# Patient Record
Sex: Female | Born: 1960
Health system: Southern US, Community
[De-identification: ages and names within clinical notes are randomized; demographics above are authoritative.]

## PROBLEM LIST (undated history)

## (undated) DIAGNOSIS — F411 Generalized anxiety disorder: Secondary | ICD-10-CM

## (undated) DIAGNOSIS — F4310 Post-traumatic stress disorder, unspecified: Secondary | ICD-10-CM

## (undated) DIAGNOSIS — F4001 Agoraphobia with panic disorder: Secondary | ICD-10-CM

## (undated) DIAGNOSIS — G4700 Insomnia, unspecified: Secondary | ICD-10-CM

## (undated) DIAGNOSIS — F4321 Adjustment disorder with depressed mood: Secondary | ICD-10-CM

## (undated) DIAGNOSIS — F331 Major depressive disorder, recurrent, moderate: Secondary | ICD-10-CM

## (undated) DIAGNOSIS — Z8659 Personal history of other mental and behavioral disorders: Secondary | ICD-10-CM

## (undated) HISTORY — PX: APPENDECTOMY: SHX54

## (undated) HISTORY — DX: Insomnia, unspecified: G47.00

## (undated) HISTORY — DX: Post-traumatic stress disorder, unspecified: F43.10

## (undated) HISTORY — DX: Generalized anxiety disorder: F41.1

## (undated) HISTORY — DX: Agoraphobia with panic disorder: F40.01

## (undated) HISTORY — DX: Major depressive disorder, recurrent, moderate: F33.1

## (undated) HISTORY — DX: Adjustment disorder with depressed mood: F43.21

## (undated) HISTORY — PX: ABDOMINAL HYSTERECTOMY: SHX81

## (undated) HISTORY — PX: KIDNEY SURGERY: SHX687

## (undated) HISTORY — DX: Personal history of other mental and behavioral disorders: Z86.59

---

## 2012-02-21 ENCOUNTER — Emergency Department: Payer: Self-pay | Admitting: Emergency Medicine

## 2012-11-30 DIAGNOSIS — F410 Panic disorder [episodic paroxysmal anxiety] without agoraphobia: Secondary | ICD-10-CM | POA: Insufficient documentation

## 2012-11-30 DIAGNOSIS — F419 Anxiety disorder, unspecified: Secondary | ICD-10-CM | POA: Insufficient documentation

## 2013-01-09 ENCOUNTER — Emergency Department: Payer: Self-pay | Admitting: Emergency Medicine

## 2013-02-25 LAB — COMPREHENSIVE METABOLIC PANEL
Alkaline Phosphatase: 79 U/L (ref 50–136)
Anion Gap: 5 — ABNORMAL LOW (ref 7–16)
Co2: 30 mmol/L (ref 21–32)
Creatinine: 0.94 mg/dL (ref 0.60–1.30)
EGFR (African American): >60
EGFR (Non-African Amer.): >60
Potassium: 3.8 mmol/L (ref 3.5–5.1)
SGPT (ALT): 18 U/L (ref 12–78)
Sodium: 138 mmol/L (ref 136–145)
Total Protein: 6.4 g/dL (ref 6.4–8.2)

## 2013-02-25 LAB — CBC
HCT: 33 % — ABNORMAL LOW (ref 35.0–47.0)
MCV: 88 fL (ref 80–100)
RDW: 13.3 % (ref 11.5–14.5)
WBC: 8.4 10*3/uL (ref 3.6–11.0)

## 2013-02-26 ENCOUNTER — Inpatient Hospital Stay: Payer: Self-pay | Admitting: Internal Medicine

## 2013-02-26 DIAGNOSIS — N200 Calculus of kidney: Secondary | ICD-10-CM | POA: Insufficient documentation

## 2013-02-26 DIAGNOSIS — J189 Pneumonia, unspecified organism: Secondary | ICD-10-CM | POA: Insufficient documentation

## 2013-02-26 DIAGNOSIS — R0602 Shortness of breath: Secondary | ICD-10-CM | POA: Insufficient documentation

## 2013-02-26 DIAGNOSIS — R079 Chest pain, unspecified: Secondary | ICD-10-CM | POA: Insufficient documentation

## 2013-02-26 DIAGNOSIS — J159 Unspecified bacterial pneumonia: Secondary | ICD-10-CM | POA: Insufficient documentation

## 2013-02-26 DIAGNOSIS — R0789 Other chest pain: Secondary | ICD-10-CM | POA: Insufficient documentation

## 2013-02-26 DIAGNOSIS — J811 Chronic pulmonary edema: Secondary | ICD-10-CM | POA: Insufficient documentation

## 2013-02-26 LAB — URINALYSIS, COMPLETE
Blood: NEGATIVE
Glucose,UR: NEGATIVE mg/dL (ref 0–75)
Leukocyte Esterase: NEGATIVE
Nitrite: NEGATIVE
Ph: 7 (ref 4.5–8.0)
RBC,UR: 2 /HPF (ref 0–5)
Squamous Epithelial: 14
WBC UR: 1 /HPF (ref 0–5)

## 2013-03-23 DIAGNOSIS — I10 Essential (primary) hypertension: Secondary | ICD-10-CM | POA: Insufficient documentation

## 2013-10-22 DIAGNOSIS — K6289 Other specified diseases of anus and rectum: Secondary | ICD-10-CM | POA: Insufficient documentation

## 2013-10-22 DIAGNOSIS — K921 Melena: Secondary | ICD-10-CM | POA: Insufficient documentation

## 2013-10-23 ENCOUNTER — Emergency Department: Payer: Self-pay | Admitting: Internal Medicine

## 2013-10-30 DIAGNOSIS — S060XAA Concussion with loss of consciousness status unknown, initial encounter: Secondary | ICD-10-CM | POA: Insufficient documentation

## 2013-10-30 DIAGNOSIS — IMO0002 Reserved for concepts with insufficient information to code with codable children: Secondary | ICD-10-CM | POA: Insufficient documentation

## 2013-10-30 DIAGNOSIS — W08XXXA Fall from other furniture, initial encounter: Secondary | ICD-10-CM | POA: Insufficient documentation

## 2013-10-30 DIAGNOSIS — S060X9A Concussion with loss of consciousness of unspecified duration, initial encounter: Secondary | ICD-10-CM | POA: Insufficient documentation

## 2014-01-26 DIAGNOSIS — G259 Extrapyramidal and movement disorder, unspecified: Secondary | ICD-10-CM | POA: Insufficient documentation

## 2014-06-25 DIAGNOSIS — T50901A Poisoning by unspecified drugs, medicaments and biological substances, accidental (unintentional), initial encounter: Secondary | ICD-10-CM | POA: Insufficient documentation

## 2014-06-25 DIAGNOSIS — G933 Postviral fatigue syndrome: Secondary | ICD-10-CM | POA: Insufficient documentation

## 2014-06-25 DIAGNOSIS — R635 Abnormal weight gain: Secondary | ICD-10-CM | POA: Insufficient documentation

## 2014-06-25 DIAGNOSIS — G9331 Postviral fatigue syndrome: Secondary | ICD-10-CM | POA: Insufficient documentation

## 2014-08-06 DIAGNOSIS — J4520 Mild intermittent asthma, uncomplicated: Secondary | ICD-10-CM | POA: Insufficient documentation

## 2014-10-02 ENCOUNTER — Other Ambulatory Visit: Payer: Self-pay

## 2014-10-02 DIAGNOSIS — Z8701 Personal history of pneumonia (recurrent): Secondary | ICD-10-CM | POA: Insufficient documentation

## 2014-10-02 DIAGNOSIS — Z9189 Other specified personal risk factors, not elsewhere classified: Secondary | ICD-10-CM | POA: Insufficient documentation

## 2014-10-02 DIAGNOSIS — F4310 Post-traumatic stress disorder, unspecified: Secondary | ICD-10-CM | POA: Insufficient documentation

## 2014-10-02 DIAGNOSIS — F332 Major depressive disorder, recurrent severe without psychotic features: Secondary | ICD-10-CM | POA: Insufficient documentation

## 2014-10-02 DIAGNOSIS — F411 Generalized anxiety disorder: Secondary | ICD-10-CM | POA: Insufficient documentation

## 2014-10-02 DIAGNOSIS — J449 Chronic obstructive pulmonary disease, unspecified: Secondary | ICD-10-CM | POA: Insufficient documentation

## 2014-10-02 DIAGNOSIS — F4001 Agoraphobia with panic disorder: Secondary | ICD-10-CM | POA: Insufficient documentation

## 2014-10-02 DIAGNOSIS — F410 Panic disorder [episodic paroxysmal anxiety] without agoraphobia: Secondary | ICD-10-CM | POA: Insufficient documentation

## 2014-10-02 DIAGNOSIS — Z87442 Personal history of urinary calculi: Secondary | ICD-10-CM | POA: Insufficient documentation

## 2014-10-02 DIAGNOSIS — Z709 Sex counseling, unspecified: Secondary | ICD-10-CM | POA: Insufficient documentation

## 2014-10-02 DIAGNOSIS — F4321 Adjustment disorder with depressed mood: Secondary | ICD-10-CM | POA: Insufficient documentation

## 2014-10-02 DIAGNOSIS — F331 Major depressive disorder, recurrent, moderate: Secondary | ICD-10-CM | POA: Insufficient documentation

## 2014-10-02 DIAGNOSIS — G4700 Insomnia, unspecified: Secondary | ICD-10-CM | POA: Insufficient documentation

## 2014-10-02 DIAGNOSIS — R4020 Unspecified coma: Secondary | ICD-10-CM | POA: Insufficient documentation

## 2014-10-16 NOTE — Discharge Summary (Signed)
PATIENT NAME:  Carol Hensley, [name] MR#:  [id number] DATE OF BIRTH:  06-17-1961  PRIMARY PHYSICIAN: Sharlyn Bolognaobert Luke Kinner, MD.   The patient left AMA.   HOSPITAL COURSE: The patient is a 54 year old Caucasian female with history of kidney stone who presented to the ED with acute-on-chronic left flank pain. She was recently diagnosed with no obstructing kidney stone in the ED. Her pain was sharp, located on the left side with radiation to the groin area.   In addition, the patient also had a sharp pain on the left side of the chest which is pleuritic in nature, worsened by deep breathing. The patient's O2 oxygen saturation was 85%, requiring oxygen in the ED.   For detailed history and physical examination, refer to the admission note dictated by Dr. Clint GuyHower. The patient was admitted for pleuritic chest pain with associated hypoxia. Dr. Clint GuyHower ordered D-dimer which was elevated at 0.6. Dr. Clint GuyHower ordered a CAT scan of chest with PE protocol, but the patient declined due to a concern about her kidney function. The patient's BUN and creatinine are normal. Dr. Clint GuyHower and I explained to the patient about the importance of a CAT scan of the chest to rule out a PE, but the patient declined and she wants to go to [facility] to see  a nephrologist.   She wants to sign AMA. I explained in detail about the importance of getting a CAT scan of the chest to rule out the PE. The patient finally left AMA. I discussed the patient's issue with the patient, the patient's family member, case Production designer, theatre/television/filmmanager, and nurse.   TIME SPENT: About 42 minutes.    ____________________________ Shaune PollackQing Wilmon Conover, MD qc:np D: 02/26/2013 13:55:10 ET T: 02/26/2013 15:42:24 ET JOB#: [id number]  cc: Shaune PollackQing Verbon Giangregorio, MD, <Dictator> Shaune PollackQING Lauretta Sallas MD ELECTRONICALLY SIGNED 02/27/2013 15:37

## 2014-10-16 NOTE — H&P (Signed)
PATIENT NAME:  Carol Hensley, Carol Hensley MR#:  [id number] DATE OF BIRTH:  1960/07/14  DATE OF ADMISSION:  02/26/2013  REFERRING PHYSICIAN:  Dr. Manson PasseyBrown   PRIMARY CARE PHYSICIAN:  Dr. Cyril LoosenKinner  HISTORY OF PRESENT ILLNESS:  This patient is a 54 year old Caucasian female with past medical history of kidney stones who is presenting with acute on chronic left flank pain. She had recently been diagnosed with nonobstructive kidney stones at the Emergency Department.  Her pain is sharp and located over the left side with radiation to the groin, 8 to 10 out of 10 in intensity without worsening or relieving factors. She has no associated dysuria or change in urinary frequency, fevers or chills. She is also mentioning having acute chest pain with shortness of breath that started approximately three days ago when she was resting. No prior episodes of chest pain. Her pain is sharp, 8 out of 10 in intensity and nonradiating over the left chest and  midclavicular line, which is pleuritic in nature, worsened by deep breathing. She was found that it is nonexertional. She has had no relieving factors in the Emergency Department. She was found to be hypoxemic with an SAO2 of 85% requiring oxygen therapy.   REVIEW OF SYSTEMS: CONSTITUTIONAL: Denies fevers, chills, fatigue, weight changes.  EYES: Denies visual change no eye pain.  ENT AND MOUTH:  Denies ear pain or dysphagia.  CARDIOVASCULAR: Chest pain as above. Denies palpitations, respirations, shortness of breath as above. Denies cough or hemoptysis.  GASTROINTESTINAL: Denies nausea, vomiting, diarrhea or constipation.  GENITOURINARY:  Denies dysuria, as well as flank pain.  MUSCULOSKELETAL: Positive for flank pain.  SKIN: Denies rashes or lesions.  NEUROLOGIC: Denies paralysis, paresthesias.  PSYCHIATRIC: Denies anxiety or depression.  ENDOCRINE: Denies nocturia or thyroid problems.  HEMATOLOGIC/LYMPHATICS: Denies easy bruising or bleeding.  ALLERY/IMMUNOLOGY: No active  symptoms.   Otherwise, full review of systems performed by me is negative.   PAST MEDICAL HISTORY: Significant for nephrolithiasis.   FAMILY HISTORY:  Brother with kidney stone.   SOCIAL HISTORY:  She denies alcohol, tobacco or drug use. She is on disability after apparently being septic back in 1998. She has not returned to work;  prior to that, she worked in a bank. She denies any occupational exposures to dust or fumes.   ALLERGIES: HYDROCODONE, PROCHLOPERAZINE AND SULFA DRUGS.   HOME MEDICATIONS:  Acetaminophen/oxycodone 325/5 Hensley.o. q.6 hours as needed for pain, which was recently started from the Emergency Department in [place]. She also takes Premarin 0.9 mg tablets, also, Urocrit 10 mEq b.i.d.   PHYSICAL EXAMINATION: VITAL SIGNS:  Temperature 98.4, pulse 71, respirations 24, blood pressure 111/60. pulse oximetry 87% and saturating with nasal cannula to have improved to the mid-90s.  GENERAL: In no acute distress. Awake, alert and oriented x 3.  HEENT: Normocephalic, atraumatic. Extraocular muscles intact. Pupils equal, round, reactive to light as well as accommodation. Moist membranes.  CARDIOVASCULAR: S1, S2, regular rate and rhythm. No murmurs, rubs or gallops.  PULMONARY: Right lower lobe fine crackles with good air entry bilaterally.  ABDOMEN: Soft, nontender, nondistended, positive bowel sounds.  EXTREMITIES: Reveal no cyanosis, edema or clubbing.  In her back, she does have positive CVA tenderness.   LABORATORY DATA: Sodium 130 potassium 3.8, chloride 103, bicarb 30, BUN 13, creatinine 0.94, glucose 101. WBC 8.4, hemoglobin 11.5, platelets 290.  Urinalysis negative, leukocyte esterase and nitrite 1+. WBC 14 epithelial, negative for signs of infection. Chest x-ray revealing bilateral pleural effusions, left is minimal, right moderate. There  is also evidence of right lower lobe interstitial infiltrates. CT of the abdomen performed revealing small right effusion with peripheral  fibrosis the lungs, otherwise, there is no obstruction.  There are no obstructive stones in the left kidney without evidence of hydronephrosis.   ASSESSMENT AND PLAN: A 54 year old female with history of kidney stones presented with acute and recurrent left flank pain. She had been diagnosed with nonobstructing kidney stones in the [facility] ED. She also complaining of acute shortness of breath and found to be hypoxemic.  1.  Pleuritic chest pain with associated hypoxemia. Her WELL score for pulmonary embolus is 3, placing her intermediate risk category which is elevated higher by her use of Premarin. I have ordered a CT chest to rule out pulmonary embolus and now. Her CT abdomen shows small right pleural effusions with evidence of peripheral fibrosis. She has no known lung disorders. Denies tobacco abuse, occupational exposures. Place supplemental O2 to keep SAO2 greater than 92% and maintain on duoneb therapies as well as incentive spirometry. The patient actually refuses to get a CT with contrast at this time over concern about kidney function. I have assured her that IV contrast is not contraindicated with kidney stones, and she has absolutely no contraindication of the study and also explained that if pulmonary embolus is indeed the diagnosis and goes untreated, she may die.  She understands and does not want to go forward with the CT scan at this time. I have ordered treatment dose lovenox as well as set up for a V/Q scan to be performed   2.  Nonobstructive left nephrolithiasis. Continue with pain control,  she may need urology consult. 3.  The patient is a FULL CODE.   TIME SPENT:  35 MINUTES.    ____________________________ Cletis Athens. Hower, MD dkh:nts D: 02/26/2013 04:12:50 ET T: 02/26/2013 04:51:52 ET JOB#: [id number]  cc: Cletis Athens. Hower, MD, <Dictator> DAVID Synetta Shadow MD ELECTRONICALLY SIGNED 03/05/2013 22:04

## 2014-12-08 ENCOUNTER — Ambulatory Visit: Payer: 59 | Admitting: Licensed Clinical Social Worker

## 2014-12-17 ENCOUNTER — Ambulatory Visit (INDEPENDENT_AMBULATORY_CARE_PROVIDER_SITE_OTHER): Payer: 59 | Admitting: Licensed Clinical Social Worker

## 2014-12-17 DIAGNOSIS — F4321 Adjustment disorder with depressed mood: Secondary | ICD-10-CM | POA: Diagnosis not present

## 2014-12-17 DIAGNOSIS — F331 Major depressive disorder, recurrent, moderate: Secondary | ICD-10-CM | POA: Diagnosis not present

## 2014-12-17 DIAGNOSIS — F4001 Agoraphobia with panic disorder: Secondary | ICD-10-CM
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
## 2014-12-18 DIAGNOSIS — F331 Major depressive disorder, recurrent, moderate: Secondary | ICD-10-CM | POA: Insufficient documentation

## 2014-12-18 DIAGNOSIS — F4321 Adjustment disorder with depressed mood: Secondary | ICD-10-CM | POA: Insufficient documentation

## 2014-12-18 DIAGNOSIS — F4001 Agoraphobia with panic disorder: Secondary | ICD-10-CM | POA: Insufficient documentation

## 2014-12-21 ENCOUNTER — Telehealth: Payer: Self-pay

## 2014-12-21 DIAGNOSIS — N390 Urinary tract infection, site not specified: Secondary | ICD-10-CM | POA: Insufficient documentation

## 2014-12-21 DIAGNOSIS — I10 Essential (primary) hypertension: Secondary | ICD-10-CM | POA: Insufficient documentation

## 2014-12-21 DIAGNOSIS — H00039 Abscess of eyelid unspecified eye, unspecified eyelid: Secondary | ICD-10-CM | POA: Insufficient documentation

## 2014-12-21 DIAGNOSIS — Z8719 Personal history of other diseases of the digestive system: Secondary | ICD-10-CM | POA: Insufficient documentation

## 2014-12-21 DIAGNOSIS — Z8659 Personal history of other mental and behavioral disorders: Secondary | ICD-10-CM | POA: Insufficient documentation

## 2014-12-21 DIAGNOSIS — G629 Polyneuropathy, unspecified: Secondary | ICD-10-CM | POA: Insufficient documentation

## 2014-12-21 DIAGNOSIS — N760 Acute vaginitis: Secondary | ICD-10-CM | POA: Insufficient documentation

## 2014-12-21 NOTE — Telephone Encounter (Signed)
Pt called states that she was seen on 12-23-14 and that she is having some issues.  Tried to get more info out of patient but she would not tell me anything.  She only wants to speak with you.

## 2014-12-23 ENCOUNTER — Ambulatory Visit: Payer: 59 | Admitting: Licensed Clinical Social Worker

## 2014-12-29 ENCOUNTER — Ambulatory Visit: Payer: 59 | Admitting: Licensed Clinical Social Worker

## 2014-12-30 ENCOUNTER — Ambulatory Visit: Payer: 59 | Admitting: Licensed Clinical Social Worker

## 2015-01-01 ENCOUNTER — Ambulatory Visit: Payer: 59 | Admitting: Licensed Clinical Social Worker

## 2015-01-13 ENCOUNTER — Ambulatory Visit (INDEPENDENT_AMBULATORY_CARE_PROVIDER_SITE_OTHER): Payer: 59 | Admitting: Licensed Clinical Social Worker

## 2015-01-13 DIAGNOSIS — F4321 Adjustment disorder with depressed mood: Secondary | ICD-10-CM | POA: Diagnosis not present

## 2015-01-13 DIAGNOSIS — F4001 Agoraphobia with panic disorder: Secondary | ICD-10-CM

## 2015-01-13 DIAGNOSIS — F331 Major depressive disorder, recurrent, moderate: Secondary | ICD-10-CM

## 2015-01-13 NOTE — Progress Notes (Signed)
   THERAPIST PROGRESS NOTE  Session Time: 2:00 p.m. - 3:25 p.m.  Participation Level: Active  Behavioral Response: CasualAlertAnxious  Type of Therapy: Family Therapy  Treatment Goals addressed: Anxiety and Coping  Interventions: CBT, Strength-based, Supportive, Family Systems and Reframing  Summary: Carol Hensley is a 54 y.o. female who presents with diagnoses of MDD, Recurrent, Moderate and Panic Disorder with severe panic attacks.  Last panic on Monday of this week.  [facility] after cancelling and re-scheduling several appointments. Spouse present with her in session. Affect is less flat and mood is increasingly upbeat with the exception of ongoing chronic worrying and anxiety.   [name] stated that some days it is impossible to leave her home while other days she will go out and this is still somewhat challenging yet she does it.  Themes around family dynamics, recent support from her mother while sick and sadness/disappointment in her daughter who did not seem to live up to either client or husband's expectations in terms of caring for client when sick.  On a positive note most of the estate and legal matters with father's property is almost settled.  Other themes around client's racing thoughts and self admitted, negative automatic thoughts and lack of confidence in dealing with her half brother who she experiences as, and husband agrees, demanding and arrogant.  [name] is aware that she has strategies available to her to decrease frequency of worrying and re-frame self-defeating thoughts that keep her in an inferior position to certain family members.  Spouse also identified his thoughts about client's anxiety and triggers for this. Spouse, Tresa EndoKelly, showed support to client and encouraged her to continue seeing LCSW.  [name] admits to LCSW "There are things that I need you to show me how to do."  She agreed to re-read previous hand-outs on managing stress/panic and recognizing the  role of irrational beliefs on emotional distress.  No additional concerns identified at this time.   Suicidal/Homicidal: Negativewithout intent/plan  Therapist Response:   CBT used to restructure thoughts with emphasis on helping client to manage emotions, including fear and sadness that are realistic and understandable in context of diagnosis and degree of functional losses.  Examined cognitions and used CBT to defuse thoughts of helplessness, negative forecasting and catastrophizing about symptoms and outcomes of these symptoms.  LCSW offered client psycho-education about the mental and biological/neurological foundations or factors of PTSD and the impact of these on a person's emotions, behaviors and relationships.  LCSW explored client's automatic thoughts with assertiveness and not meeting others' needs and offered much education around boundary setting, detachment with love and unhealthy enabling behaviors. Assist in developing awareness of cognitive messages that reinforce hopelessness and helplessness and assist in self recognition of healthier cognitive messages that enhance self-confidence and improve coping strategies.  Plan: Return again in two weeks.  [name] will keep all appointments and read previously provided hand-outs.  Diagnosis: Major Depressive Disorder, Recurrent, Moderate   Panic Disorder with Agoraphobia and severe panic attacks   Grief  Felecia Janina Deniss Wormley, LCSW 01/13/2015

## 2015-01-18 ENCOUNTER — Telehealth: Payer: Self-pay

## 2015-01-18 NOTE — Telephone Encounter (Signed)
FAXED [facility] BACK WITH MESSAGE THAT PT NEEDS TO CALL AN MAKE AN APPT TO SEE DR. Mayford Knife FOR REFILL

## 2015-01-18 NOTE — Telephone Encounter (Signed)
RECEIVED A FAX REQUESTING A REFILL ON ALPRAZOLAM .  PT LAST SEEN ON  08-13-14.

## 2015-01-25 ENCOUNTER — Ambulatory Visit: Payer: Self-pay | Admitting: Licensed Clinical Social Worker

## 2015-02-01 ENCOUNTER — Ambulatory Visit: Payer: Self-pay | Admitting: Licensed Clinical Social Worker

## 2015-03-03 ENCOUNTER — Ambulatory Visit: Payer: Self-pay | Admitting: Licensed Clinical Social Worker

## 2015-03-12 ENCOUNTER — Ambulatory Visit: Payer: Self-pay | Admitting: Licensed Clinical Social Worker

## 2015-03-23 ENCOUNTER — Ambulatory Visit (INDEPENDENT_AMBULATORY_CARE_PROVIDER_SITE_OTHER): Payer: 59 | Admitting: Licensed Clinical Social Worker

## 2015-03-23 DIAGNOSIS — F4321 Adjustment disorder with depressed mood: Secondary | ICD-10-CM | POA: Diagnosis not present

## 2015-03-23 DIAGNOSIS — F331 Major depressive disorder, recurrent, moderate: Secondary | ICD-10-CM

## 2015-03-23 DIAGNOSIS — F4001 Agoraphobia with panic disorder: Secondary | ICD-10-CM
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
## 2015-04-20 ENCOUNTER — Ambulatory Visit (INDEPENDENT_AMBULATORY_CARE_PROVIDER_SITE_OTHER): Payer: 59 | Admitting: Licensed Clinical Social Worker

## 2015-04-20 DIAGNOSIS — F331 Major depressive disorder, recurrent, moderate: Secondary | ICD-10-CM

## 2015-04-20 DIAGNOSIS — F4321 Adjustment disorder with depressed mood: Secondary | ICD-10-CM | POA: Diagnosis not present

## 2015-04-20 DIAGNOSIS — F4001 Agoraphobia with panic disorder: Secondary | ICD-10-CM
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
## 2015-04-21 DIAGNOSIS — M792 Neuralgia and neuritis, unspecified: Secondary | ICD-10-CM | POA: Insufficient documentation

## 2015-04-21 DIAGNOSIS — M542 Cervicalgia: Secondary | ICD-10-CM | POA: Insufficient documentation

## 2015-05-05 DIAGNOSIS — M25511 Pain in right shoulder: Secondary | ICD-10-CM | POA: Insufficient documentation

## 2015-05-26 DIAGNOSIS — M25512 Pain in left shoulder: Secondary | ICD-10-CM | POA: Insufficient documentation

## 2015-05-26 DIAGNOSIS — G8929 Other chronic pain: Secondary | ICD-10-CM | POA: Insufficient documentation

## 2015-12-29 DIAGNOSIS — H6121 Impacted cerumen, right ear: Secondary | ICD-10-CM | POA: Insufficient documentation

## 2017-01-08 ENCOUNTER — Ambulatory Visit: Payer: 59 | Admitting: Psychiatry

## 2017-01-08 ENCOUNTER — Encounter: Payer: Self-pay | Admitting: Psychiatry

## 2017-01-08 VITALS — BP 117/77 | HR 94 | Ht 58.75 in | Wt 108.0 lb

## 2017-01-08 DIAGNOSIS — F331 Major depressive disorder, recurrent, moderate: Secondary | ICD-10-CM

## 2017-01-08 MED ORDER — VENLAFAXINE HCL 75 MG PO TABS: 150.0000 mg | ORAL_TABLET | Freq: Every day | ORAL | 1 refills | Status: DC
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
## 2017-02-08 ENCOUNTER — Ambulatory Visit (INDEPENDENT_AMBULATORY_CARE_PROVIDER_SITE_OTHER): Payer: 59 | Admitting: Psychiatry

## 2017-02-08 ENCOUNTER — Encounter: Payer: Self-pay | Admitting: Psychiatry

## 2017-02-08 VITALS — BP 112/75 | HR 105 | Temp 97.3°F

## 2017-02-08 DIAGNOSIS — F331 Major depressive disorder, recurrent, moderate: Secondary | ICD-10-CM | POA: Diagnosis not present

## 2017-02-08 DIAGNOSIS — F4001 Agoraphobia with panic disorder: Secondary | ICD-10-CM

## 2017-02-08 MED ORDER — VENLAFAXINE HCL 75 MG PO TABS: 225.0000 mg | ORAL_TABLET | Freq: Every day | ORAL | 1 refills | Status: DC

## 2017-02-08 NOTE — Progress Notes (Signed)
Psychiatric progress note  Patient Identification: Carol Hensley MRN:  [id number] Date of Evaluation:  02/08/2017 Referral Source: Felecia Jan Chief Complaint:  Slightly better Chief Complaint    Follow-up; Medication Refill     Visit Diagnosis:    ICD-10-CM   1. Major depressive disorder, recurrent episode, moderate (HCC) F33.1   2. Panic disorder with agoraphobia and severe panic attacks F40.01     History of Present Illness:  Patient is a 55 year old Caucasian female with a long history of depression, anxiety and PTSD who presents today for a follow up. Reports the increase in effexor to 150mg  was helpful to some extent. Sattes she is sleeping better, not able to eat well. States she gets nauseous when she tries to eat. Denies any suicidal thoughts.  Past Psychiatric History: Once in 1998 for severe depression.  Previous Psychotropic Medications: Yes   Substance Abuse History in the last 12 months:  Yes.    Consequences of Substance Abuse: Negative  Past Medical History:  Past Medical History:  Diagnosis Date  . Agoraphobia with panic disorder   . Generalized anxiety disorder   . Grief   . History of posttraumatic stress disorder (PTSD)   . Insomnia   . Major depressive disorder, recurrent episode, moderate (HCC)   . Persistent insomnia   . Posttraumatic stress disorder     Past Surgical History:  Procedure Laterality Date  . KIDNEY SURGERY      Family Psychiatric History: see below  Family History:  Family History  Problem Relation Age of Onset  . Depression Father   . Alcohol abuse Maternal Grandmother   . Alcohol abuse Paternal Grandmother   . Depression Paternal Grandmother     Social History:   Social History   Social History  . Marital status: Legally Separated    Spouse name: N/A  . Number of children: N/A  . Years of education: N/A   Social History Main Topics  . Smoking status: Former Games developer  . Smokeless tobacco: Never Used  .  Alcohol use No  . Drug use: No  . Sexual activity: Yes    Partners: Male    Birth control/ protection: None   Other Topics Concern  . None   Social History Narrative  . None    Additional Social History: lives with husband and her daughter just moved back due to divorce  Allergies:   Allergies  Allergen Reactions  . Ciprofloxacin Nausea And Vomiting  . Hydrocodone-Acetaminophen Hives, Nausea And Vomiting and Rash  . Sulfa Antibiotics Rash  . Compazine  [Prochlorperazine]   . Prochlorperazine Maleate     Muscle spasm  . Sulfamethoxazole   . Sulfur     Metabolic Disorder Labs: No results found for: HGBA1C, MPG No results found for: PROLACTIN No results found for: CHOL, TRIG, HDL, CHOLHDL, VLDL, LDLCALC   Current Medications: Current Outpatient Prescriptions  Medication Sig Dispense Refill  . acyclovir ointment (ZOVIRAX) 5 % Apply topically.    . ALPRAZolam (XANAX) 0.5 MG tablet Take 0.25 mg by mouth at bedtime.    . Cholecalciferol (VITAMIN D3) 1000 UNITS CAPS Take 1 tablet by mouth.    . clindamycin (CLINDAGEL) 1 % gel Apply topically.    Marland Kitchen CREON 6000 UNITS CPEP     . diphenoxylate-atropine (LOMOTIL) 2.5-0.025 MG per tablet TAKE 1 TABLET BY MOUTH 4 TIMES A DAY AS NEEDED FOR DIARRHEA  5  . Estradiol (VAGIFEM) 10 MCG TABS vaginal tablet Place 10 mcg vaginally.    Marland Kitchen  estrogens, conjugated, (PREMARIN) 0.45 MG tablet Take 1 tablet by mouth daily.    . fluconazole (DIFLUCAN) 150 MG tablet Take 1 tablet once for yeast infection; may repeat once in 3 days if needed    . fluconazole (DIFLUCAN) 150 MG tablet TAKE 1 TABLET ONCE FOR YEAST INFECTION MAY REPEAT ONCE IN 3 DAYS IF NEEDED  3  . Fluticasone-Salmeterol (ADVAIR) 250-50 MCG/DOSE AEPB Inhale 1 puff into the lungs.    Marland Kitchen. ibuprofen (ADVIL,MOTRIN) 600 MG tablet     . ibuprofen (ADVIL,MOTRIN) 600 MG tablet TAKE 1 TABLET (600 MG TOTAL) BY MOUTH EVERY 6 (SIX) HOURS AS NEEDED FOR PAIN.  0  . levalbuterol (XOPENEX HFA) 45 MCG/ACT  inhaler Inhale 2 puffs into the lungs every 4 (four) hours as needed for wheezing.    . Melatonin 10 MG CAPS Take 5 mg by mouth.    . mirtazapine (REMERON) 15 MG tablet Take 1 tablet by mouth at bedtime.    . Multiple Vitamins-Minerals (MULTI ADULT GUMMIES) CHEW Chew by mouth.    . nitrofurantoin (MACRODANTIN) 100 MG capsule     . nitrofurantoin, macrocrystal-monohydrate, (MACROBID) 100 MG capsule TAKE 1 CAPSULE (100 MG TOTAL) BY MOUTH 2 (TWO) TIMES DAILY.  0  . phenazopyridine (PYRIDIUM) 100 MG tablet Take 100 mg by mouth.    . potassium citrate (UROCIT-K) 10 MEQ (1080 MG) SR tablet Take 1 tablet by mouth daily.    Marland Kitchen. PREMARIN 0.9 MG tablet     . promethazine (PHENERGAN) 25 MG tablet Take 25 mg by mouth.    . promethazine (PHENERGAN) 25 MG tablet     . senna-docusate (SENOKOT-S) 8.6-50 MG per tablet Take by mouth.    . temazepam (RESTORIL) 30 MG capsule Take 30 mg by mouth at bedtime as needed for sleep.    Marland Kitchen. terconazole (TERAZOL 3) 0.8 % vaginal cream INSERT ONE APPLICATORFUL PER VAGINA EACH NIGHT AT BEDTIME X3 DAYS AS NEEDED  3  . topiramate (TOPAMAX) 50 MG tablet Take 50 mg by mouth as needed.    Marland Kitchen. VAGIFEM 10 MCG TABS vaginal tablet     . valACYclovir (VALTREX) 500 MG tablet     . venlafaxine (EFFEXOR) 75 MG tablet Take 2 tablets (150 mg total) by mouth at bedtime. 60 tablet 1  . albuterol (PROVENTIL HFA;VENTOLIN HFA) 108 (90 BASE) MCG/ACT inhaler Inhale 2 puffs into the lungs.     No current facility-administered medications for this visit.     Neurologic: Headache: No Seizure: No Paresthesias:No  Musculoskeletal: Strength & Muscle Tone: within normal limits Gait & Station: normal Patient leans: N/A  Psychiatric Specialty Exam: ROS  Blood pressure 112/75, pulse (!) 105, temperature (!) 97.3 F (36.3 C), temperature source Oral.There is no height or weight on file to calculate BMI.  General Appearance: Casual  Eye Contact:  Fair  Speech:  Clear and Coherent  Volume:  Normal   Mood:  Slightly better  Affect:  Congruent  Thought Process:  Coherent  Orientation:  Full (Time, Place, and Person)  Thought Content:  Logical  Suicidal Thoughts:  No  Homicidal Thoughts:  No  Memory:  Immediate;   Fair Recent;   Fair Remote;   Fair  Judgement:  Fair  Insight:  Fair  Psychomotor Activity:  Normal  Concentration:  Concentration: Fair and Attention Span: Fair  Recall:  [employer]  Fund of Knowledge:Fair  Language: Fair  Akathisia:  No  Handed:  Right  AIMS (if indicated):  na  Assets:  Communication Skills  Desire for Improvement Financial Resources/Insurance Housing Physical Health Resilience Social Support Vocational/Educational  ADL's:  Intact  Cognition: WNL  Sleep:  Really well    Treatment Plan Summary: Major Depressive Disorder Increase Effexor to 225mg  once daily. Continue therapy with Felecia Jan  Anxiety Same as above  PTSD Mostly resolved  RTC to clinic in 2 month's time or call before if needed   Patrick North, MD 8/16/20181:31 PM

## 2017-04-05 ENCOUNTER — Ambulatory Visit: Payer: Self-pay | Admitting: Psychiatry

## 2018-12-24 DIAGNOSIS — E7800 Pure hypercholesterolemia, unspecified: Secondary | ICD-10-CM | POA: Insufficient documentation

## 2018-12-24 DIAGNOSIS — K589 Irritable bowel syndrome without diarrhea: Secondary | ICD-10-CM | POA: Insufficient documentation

## 2018-12-24 DIAGNOSIS — K861 Other chronic pancreatitis: Secondary | ICD-10-CM | POA: Insufficient documentation

## 2018-12-24 DIAGNOSIS — F3340 Major depressive disorder, recurrent, in remission, unspecified: Secondary | ICD-10-CM | POA: Insufficient documentation

## 2018-12-24 DIAGNOSIS — E039 Hypothyroidism, unspecified: Secondary | ICD-10-CM | POA: Insufficient documentation

## 2018-12-24 DIAGNOSIS — N390 Urinary tract infection, site not specified: Secondary | ICD-10-CM | POA: Insufficient documentation

## 2018-12-24 DIAGNOSIS — F3341 Major depressive disorder, recurrent, in partial remission: Secondary | ICD-10-CM | POA: Insufficient documentation

## 2019-01-16 ENCOUNTER — Other Ambulatory Visit: Payer: Self-pay

## 2019-01-16 ENCOUNTER — Other Ambulatory Visit
Admission: RE | Admit: 2019-01-16 | Discharge: 2019-01-16 | Disposition: A | Discharge status: Home / Self Care | Payer: 59 | Attending: Gastroenterology | Admitting: Gastroenterology

## 2019-01-16 DIAGNOSIS — R197 Diarrhea, unspecified: Secondary | ICD-10-CM | POA: Insufficient documentation

## 2019-01-16 LAB — GASTROINTESTINAL PANEL BY PCR, STOOL (REPLACES STOOL CULTURE)

## 2019-01-16 LAB — C DIFFICILE QUICK SCREEN W PCR REFLEX
C Diff antigen: POSITIVE — AB
C Diff toxin: NEGATIVE

## 2019-01-16 LAB — CLOSTRIDIUM DIFFICILE BY PCR, REFLEXED: Toxigenic C. Difficile by PCR: POSITIVE — AB

## 2019-01-20 ENCOUNTER — Ambulatory Visit
Admission: RE | Admit: 2019-01-20 | Discharge: 2019-01-20 | Disposition: A | Discharge status: Home / Self Care | Payer: 59 | Attending: Gastroenterology | Admitting: Gastroenterology

## 2019-01-20 ENCOUNTER — Other Ambulatory Visit: Payer: Self-pay

## 2019-01-20 ENCOUNTER — Ambulatory Visit
Admission: RE | Admit: 2019-01-20 | Discharge: 2019-01-20 | Disposition: A | Discharge status: Home / Self Care | Payer: 59 | Source: Ambulatory Visit | Attending: Gastroenterology | Admitting: Gastroenterology

## 2019-01-20 ENCOUNTER — Other Ambulatory Visit: Payer: Self-pay | Admitting: Gastroenterology

## 2019-01-20 DIAGNOSIS — R1012 Left upper quadrant pain: Secondary | ICD-10-CM

## 2019-01-20 DIAGNOSIS — A0472 Enterocolitis due to Clostridium difficile, not specified as recurrent: Secondary | ICD-10-CM

## 2019-01-20 LAB — MISC LABCORP TEST (SEND OUT)
Labcorp test code: 123234
Labcorp test code: 123255

## 2019-02-20 ENCOUNTER — Other Ambulatory Visit: Payer: Self-pay

## 2019-02-20 ENCOUNTER — Other Ambulatory Visit
Admission: RE | Admit: 2019-02-20 | Discharge: 2019-02-20 | Disposition: A | Discharge status: Home / Self Care | Payer: 59 | Source: Ambulatory Visit | Attending: Gastroenterology | Admitting: Gastroenterology

## 2019-02-20 DIAGNOSIS — Z01812 Encounter for preprocedural laboratory examination: Secondary | ICD-10-CM | POA: Diagnosis present

## 2019-02-20 DIAGNOSIS — Z20828 Contact with and (suspected) exposure to other viral communicable diseases: Secondary | ICD-10-CM | POA: Diagnosis not present

## 2019-02-20 LAB — SARS CORONAVIRUS 2 (TAT 6-24 HRS): SARS Coronavirus 2: NEGATIVE

## 2019-02-24 ENCOUNTER — Encounter
Admission: RE | Disposition: A | Discharge status: Home / Self Care | Payer: Self-pay | Source: Home / Self Care | Attending: Gastroenterology

## 2019-02-24 ENCOUNTER — Ambulatory Visit
Admission: RE | Admit: 2019-02-24 | Discharge: 2019-02-24 | Disposition: A | Discharge status: Home / Self Care | Payer: 59 | Attending: Gastroenterology | Admitting: Gastroenterology

## 2019-02-24 ENCOUNTER — Ambulatory Visit: Payer: 59 | Admitting: Anesthesiology

## 2019-02-24 ENCOUNTER — Encounter: Payer: Self-pay | Admitting: Gastroenterology

## 2019-02-24 DIAGNOSIS — F338 Other recurrent depressive disorders: Secondary | ICD-10-CM | POA: Insufficient documentation

## 2019-02-24 DIAGNOSIS — D802 Selective deficiency of immunoglobulin A [IgA]: Secondary | ICD-10-CM | POA: Diagnosis not present

## 2019-02-24 DIAGNOSIS — Z885 Allergy status to narcotic agent status: Secondary | ICD-10-CM | POA: Diagnosis not present

## 2019-02-24 DIAGNOSIS — Z882 Allergy status to sulfonamides status: Secondary | ICD-10-CM | POA: Diagnosis not present

## 2019-02-24 DIAGNOSIS — Z79890 Hormone replacement therapy (postmenopausal): Secondary | ICD-10-CM | POA: Diagnosis not present

## 2019-02-24 DIAGNOSIS — K529 Noninfective gastroenteritis and colitis, unspecified: Secondary | ICD-10-CM | POA: Insufficient documentation

## 2019-02-24 DIAGNOSIS — Z79899 Other long term (current) drug therapy: Secondary | ICD-10-CM | POA: Insufficient documentation

## 2019-02-24 DIAGNOSIS — F4310 Post-traumatic stress disorder, unspecified: Secondary | ICD-10-CM | POA: Insufficient documentation

## 2019-02-24 DIAGNOSIS — Z7951 Long term (current) use of inhaled steroids: Secondary | ICD-10-CM | POA: Insufficient documentation

## 2019-02-24 DIAGNOSIS — Z888 Allergy status to other drugs, medicaments and biological substances status: Secondary | ICD-10-CM | POA: Diagnosis not present

## 2019-02-24 HISTORY — PX: COLONOSCOPY WITH PROPOFOL: SHX5780

## 2019-02-24 SURGERY — COLONOSCOPY WITH PROPOFOL
Anesthesia: General

## 2019-02-24 MED ORDER — SODIUM CHLORIDE 0.9 % IV SOLN
INTRAVENOUS | Status: DC
  Administered 2019-02-24: 13:00:00 via INTRAVENOUS

## 2019-02-24 MED ORDER — FENTANYL CITRATE (PF) 100 MCG/2ML IJ SOLN
INTRAMUSCULAR | Status: DC | PRN
  Administered 2019-02-24: 50 ug via INTRAVENOUS

## 2019-02-24 MED ORDER — MIDAZOLAM HCL 2 MG/2ML IJ SOLN
INTRAMUSCULAR | Status: AC
  Filled 2019-02-24: qty 2

## 2019-02-24 MED ORDER — PROPOFOL 10 MG/ML IV BOLUS
INTRAVENOUS | Status: AC
  Filled 2019-02-24: qty 20

## 2019-02-24 MED ORDER — LIDOCAINE HCL (CARDIAC) PF 100 MG/5ML IV SOSY
PREFILLED_SYRINGE | INTRAVENOUS | Status: DC | PRN
  Administered 2019-02-24: 100 mg via INTRAVENOUS

## 2019-02-24 MED ORDER — PROPOFOL 10 MG/ML IV BOLUS
INTRAVENOUS | Status: DC | PRN
  Administered 2019-02-24: 80 mg via INTRAVENOUS

## 2019-02-24 MED ORDER — MIDAZOLAM HCL 2 MG/2ML IJ SOLN
INTRAMUSCULAR | Status: DC | PRN
  Administered 2019-02-24: 2 mg via INTRAVENOUS

## 2019-02-24 MED ORDER — PROPOFOL 500 MG/50ML IV EMUL
INTRAVENOUS | Status: DC | PRN
  Administered 2019-02-24: 180 ug/kg/min via INTRAVENOUS

## 2019-02-24 MED ORDER — FENTANYL CITRATE (PF) 100 MCG/2ML IJ SOLN
INTRAMUSCULAR | Status: AC
  Filled 2019-02-24: qty 2

## 2019-02-24 NOTE — Transfer of Care (Signed)
Immediate Anesthesia Transfer of Care Note  Patient: Carol Hensley  Procedure(s) Performed: COLONOSCOPY WITH PROPOFOL (N/A )  Patient Location: PACU  Anesthesia Type:General  Level of Consciousness: awake  Airway & Oxygen Therapy: Patient Spontanous Breathing  Post-op Assessment: Report given to RN and Post -op Vital signs reviewed and stable  Post vital signs: Reviewed and stable  Last Vitals:  Vitals Value Taken Time  BP 112/101 02/24/19 1448  Temp 36.4 C 02/24/19 1447  Pulse 92 02/24/19 1448  Resp 16 02/24/19 1448  SpO2 99 % 02/24/19 1448  Vitals shown include unvalidated device data.  Last Pain:  Vitals:   02/24/19 1447  TempSrc: Tympanic  PainSc: 0-No pain         Complications: No apparent anesthesia complications

## 2019-02-24 NOTE — Anesthesia Procedure Notes (Signed)
Date/Time: 02/24/2019 2:15 PM Performed by: Allean Found, CRNA Pre-anesthesia Checklist: Patient identified, Emergency Drugs available, Suction available, Patient being monitored and Timeout performed Oxygen Delivery Method: Nasal cannula Placement Confirmation: positive ETCO2

## 2019-02-24 NOTE — Anesthesia Preprocedure Evaluation (Addendum)
Anesthesia Evaluation  Patient identified by MRN, date of birth, ID band Patient awake    Reviewed: Allergy & Precautions, NPO status , Patient's Chart, lab work & pertinent test results  History of Anesthesia Complications Negative for: history of anesthetic complications  Airway Mallampati: III       Dental   Pulmonary neg sleep apnea, neg COPD, Not current smoker,           Cardiovascular (-) hypertension(-) Past MI and (-) CHF (-) dysrhythmias (-) Valvular Problems/Murmurs     Neuro/Psych neg Seizures Anxiety Depression    GI/Hepatic Neg liver ROS, neg GERD  ,  Endo/Other  neg diabetes  Renal/GU Renal disease (stones)     Musculoskeletal   Abdominal   Peds  Hematology   Anesthesia Other Findings   Reproductive/Obstetrics                            Anesthesia Physical Anesthesia Plan  ASA: II  Anesthesia Plan: General   Post-op Pain Management:    Induction: Intravenous  PONV Risk Score and Plan: 3 and Propofol infusion, TIVA and Treatment may vary due to age or medical condition  Airway Management Planned: Nasal Cannula  Additional Equipment:   Intra-op Plan:   Post-operative Plan:   Informed Consent: I have reviewed the patients History and Physical, chart, labs and discussed the procedure including the risks, benefits and alternatives for the proposed anesthesia with the patient or authorized representative who has indicated his/her understanding and acceptance.       Plan Discussed with:   Anesthesia Plan Comments:         Anesthesia Quick Evaluation

## 2019-02-24 NOTE — Op Note (Addendum)
[facility] Patient Name: Carol Hensley Procedure Date: 02/24/2019 2:06 PM MRN: [id number] Account #: [id number] Date of Birth: 1960-10-14 Admit Type: Outpatient Age: 58 Room: [facility] ENDO ROOM 3 Gender: Female Note Status: Finalized Procedure:            Colonoscopy Indications:          Chronic diarrhea Providers:            Lollie Sails, MD Referring MD:         Ocie Cornfield. Ouida Sills MD, MD (Referring MD) Medicines:            Monitored Anesthesia Care Complications:        No immediate complications. Procedure:            Pre-Anesthesia Assessment:                       - ASA Grade Assessment: II - A patient with mild                        systemic disease.                       After obtaining informed consent, the colonoscope was                        passed under direct vision. Throughout the procedure,                        the patient's blood pressure, pulse, and oxygen                        saturations were monitored continuously. The                        Colonoscope was introduced through the anus and                        advanced to the the cecum, identified by appendiceal                        orifice and ileocecal valve. The colonoscopy was                        performed without difficulty. The patient tolerated the                        procedure well. The quality of the bowel preparation                        was good. Findings:      A diffuse and patchy area of mildly nodular mucosa was found in the       proximal transverse colon, in the ascending colon and in the cecum.       Appearance of prominant lymph tissue. Biopsies for histology were taken       with a cold forceps from the right colon and left colon for evaluation       of microscopic colitis.      The exam was otherwise without abnormality.      The retroflexed view of the distal rectum and anal verge was normal and  showed no anal or rectal  abnormalities.      The digital rectal exam was normal. Impression:           - Nodular mucosa in the proximal transverse colon, in                        the ascending colon and in the cecum. Biopsied.                       - The examination was otherwise normal.                       - The distal rectum and anal verge are normal on                        retroflexion view. Recommendation:       - Soft diet today, then advance as tolerated to advance                        diet as tolerated.                       - Use Questran at 1 packet (4 grams) PO daily.                       - Return to GI clinic in 2-3 weeks. Procedure Code(s):    --- Professional ---                       [id number], Colonoscopy, flexible; with biopsy, single or                        multiple Diagnosis Code(s):    --- Professional ---                       K52.9, Noninfective gastroenteritis and colitis,                        unspecified CPT copyright 2019 American Medical Association. All rights reserved. The codes documented in this report are preliminary and upon coder review may  be revised to meet current compliance requirements. Christena DeemMartin U Skulskie, MD 02/24/2019 2:43:52 PM This report has been signed electronically. Number of Addenda: 0 Note Initiated On: 02/24/2019 2:06 PM Scope Withdrawal Time: 0 hours 11 minutes 0 seconds  Total Procedure Duration: 0 hours 18 minutes 1 second       [facility]

## 2019-02-24 NOTE — Anesthesia Post-op Follow-up Note (Signed)
Anesthesia QCDR form completed.        

## 2019-02-24 NOTE — Anesthesia Postprocedure Evaluation (Signed)
Anesthesia Post Note  Patient: Carol Hensley  Procedure(s) Performed: COLONOSCOPY WITH PROPOFOL (N/A )  Patient location during evaluation: Endoscopy Anesthesia Type: General Level of consciousness: awake and alert Pain management: pain level controlled Vital Signs Assessment: post-procedure vital signs reviewed and stable Respiratory status: spontaneous breathing and respiratory function stable Cardiovascular status: stable Anesthetic complications: no     Last Vitals:  Vitals:   02/24/19 1447 02/24/19 1457  BP: (!) 95/52 105/64  Pulse: 94 84  Resp: 15 20  Temp: (!) 36.4 C   SpO2: 99% 100%    Last Pain:  Vitals:   02/24/19 1457  TempSrc:   PainSc: 0-No pain                 [name]

## 2019-02-24 NOTE — H&P (Signed)
Outpatient short stay form Pre-procedure 02/24/2019 1:52 PM Carol DeemMartin U Roseann Kees MD  Primary Physician: Dr. Einar CrowMarshall Anderson  Reason for visit: Colonoscopy  History of present illness: Patient is a 58 year old female presenting today for colonoscopy in regards to her history of diarrhea.  This been a chronic issue for at least 3 years.  Is never had a colonoscopy in the past.  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  Tolerated her prep well.  She takes no aspirin or blood thinning agent.  She had previously been on Creon does have a history of pancreatitis however when the Creon was discontinued there was no change to her bowel habits.  Further she has had a cholecystectomy and is not on a bile sequestrant resin.    Current Facility-Administered Medications:  .  0.9 %  sodium chloride infusion, , Intravenous, Continuous, Carol DeemSkulskie, Brinson Tozzi U, MD, Last Rate: 20 mL/hr at 02/24/19 1238  Medications Prior to Admission  Medication Sig Dispense Refill Last Dose  . acyclovir ointment (ZOVIRAX) 5 % Apply topically.   Past Month at Unknown time  . Cholecalciferol (VITAMIN D3) 1000 UNITS CAPS Take 1 tablet by mouth.   Past Month at Unknown time  . clindamycin (CLINDAGEL) 1 % gel Apply topically.   Past Month at Unknown time  . CREON 6000 UNITS CPEP    Past Month at Unknown time  . diphenoxylate-atropine (LOMOTIL) 2.5-0.025 MG per tablet TAKE 1 TABLET BY MOUTH 4 TIMES A DAY AS NEEDED FOR DIARRHEA  5 Past Month at Unknown time  . Estradiol (VAGIFEM) 10 MCG TABS vaginal tablet Place 10 mcg vaginally.   Past Month at Unknown time  . estrogens, conjugated, (PREMARIN) 0.45 MG tablet Take 1 tablet by mouth daily.   Past Month at Unknown time  . fluconazole (DIFLUCAN) 150 MG tablet Take 1 tablet once for yeast infection; may repeat once in 3 days if needed   Past Month at Unknown time  . fluconazole (DIFLUCAN) 150 MG tablet TAKE 1 TABLET ONCE FOR YEAST INFECTION MAY REPEAT ONCE IN 3 DAYS IF NEEDED  3 Past Month at  Unknown time  . Fluticasone-Salmeterol (ADVAIR) 250-50 MCG/DOSE AEPB Inhale 1 puff into the lungs.   Past Month at Unknown time  . ibuprofen (ADVIL,MOTRIN) 600 MG tablet    Past Month at Unknown time  . ibuprofen (ADVIL,MOTRIN) 600 MG tablet TAKE 1 TABLET (600 MG TOTAL) BY MOUTH EVERY 6 (SIX) HOURS AS NEEDED FOR PAIN.  0 Past Month at Unknown time  . levalbuterol (XOPENEX HFA) 45 MCG/ACT inhaler Inhale 2 puffs into the lungs every 4 (four) hours as needed for wheezing.   Past Month at Unknown time  . Melatonin 10 MG CAPS Take 5 mg by mouth.   Past Month at Unknown time  . mirtazapine (REMERON) 15 MG tablet Take 1 tablet by mouth at bedtime.   Past Month at Unknown time  . Multiple Vitamins-Minerals (MULTI ADULT GUMMIES) CHEW Chew by mouth.   Past Month at Unknown time  . nitrofurantoin (MACRODANTIN) 100 MG capsule    Past Month at Unknown time  . nitrofurantoin, macrocrystal-monohydrate, (MACROBID) 100 MG capsule TAKE 1 CAPSULE (100 MG TOTAL) BY MOUTH 2 (TWO) TIMES DAILY.  0 Past Month at Unknown time  . phenazopyridine (PYRIDIUM) 100 MG tablet Take 100 mg by mouth.   Past Month at Unknown time  . potassium citrate (UROCIT-K) 10 MEQ (1080 MG) SR tablet Take 1 tablet by mouth daily.   Past Month at Unknown time  .  PREMARIN 0.9 MG tablet    Past Month at Unknown time  . promethazine (PHENERGAN) 25 MG tablet Take 25 mg by mouth.   Past Month at Unknown time  . promethazine (PHENERGAN) 25 MG tablet    Past Month at Unknown time  . senna-docusate (SENOKOT-S) 8.6-50 MG per tablet Take by mouth.   Past Month at Unknown time  . temazepam (RESTORIL) 30 MG capsule Take 30 mg by mouth at bedtime as needed for sleep.   Past Month at Unknown time  . terconazole (TERAZOL 3) 0.8 % vaginal cream INSERT ONE APPLICATORFUL PER VAGINA EACH NIGHT AT BEDTIME X3 DAYS AS NEEDED  3 Past Month at Unknown time  . topiramate (TOPAMAX) 50 MG tablet Take 50 mg by mouth as needed.   Past Month at Unknown time  . VAGIFEM 10 MCG  TABS vaginal tablet    Past Month at Unknown time  . valACYclovir (VALTREX) 500 MG tablet    Past Month at Unknown time  . venlafaxine (EFFEXOR) 75 MG tablet Take 3 tablets (225 mg total) by mouth at bedtime. 90 tablet 1 Past Month at Unknown time  . albuterol (PROVENTIL HFA;VENTOLIN HFA) 108 (90 BASE) MCG/ACT inhaler Inhale 2 puffs into the lungs.        Allergies  Allergen Reactions  . Ciprofloxacin Nausea And Vomiting  . Hydrocodone-Acetaminophen Hives, Nausea And Vomiting and Rash  . Sulfa Antibiotics Rash  . Compazine  [Prochlorperazine]   . Prochlorperazine Maleate     Muscle spasm  . Sulfamethoxazole   . Sulfur      Past Medical History:  Diagnosis Date  . Agoraphobia with panic disorder   . Generalized anxiety disorder   . Grief   . History of posttraumatic stress disorder (PTSD)   . Insomnia   . Major depressive disorder, recurrent episode, moderate ([place])   . Persistent insomnia   . Posttraumatic stress disorder     Review of systems:      Physical Exam    Heart and lungs: Regular rate and rhythm without rub or gallop lungs are bilaterally clear    HEENT: Normocephalic atraumatic eyes are anicteric    Other:    Pertinant exam for procedure: Soft nontender nondistended bowel sounds positive normoactive    Planned proceedures: Colonoscopy and indicated procedures. I have discussed the risks benefits and complications of procedures to include not limited to bleeding, infection, perforation and the risk of sedation and the patient wishes to proceed.    Lollie Sails, MD Gastroenterology 02/24/2019  1:52 PM

## 2019-02-26 LAB — SURGICAL PATHOLOGY

## 2019-04-04 ENCOUNTER — Other Ambulatory Visit: Payer: Self-pay

## 2019-04-04 DIAGNOSIS — N200 Calculus of kidney: Secondary | ICD-10-CM

## 2019-04-08 ENCOUNTER — Ambulatory Visit: Payer: Self-pay | Admitting: Urology

## 2019-04-24 ENCOUNTER — Encounter: Payer: Self-pay | Admitting: Psychiatry

## 2019-04-24 ENCOUNTER — Ambulatory Visit (INDEPENDENT_AMBULATORY_CARE_PROVIDER_SITE_OTHER): Payer: 59 | Admitting: Psychiatry

## 2019-04-24 ENCOUNTER — Other Ambulatory Visit: Payer: Self-pay

## 2019-04-24 DIAGNOSIS — F419 Anxiety disorder, unspecified: Secondary | ICD-10-CM | POA: Diagnosis not present

## 2019-04-24 DIAGNOSIS — F331 Major depressive disorder, recurrent, moderate: Secondary | ICD-10-CM | POA: Diagnosis not present

## 2019-04-24 DIAGNOSIS — H00019 Hordeolum externum unspecified eye, unspecified eyelid: Secondary | ICD-10-CM | POA: Insufficient documentation

## 2019-04-24 MED ORDER — TRAZODONE HCL 50 MG PO TABS: ORAL_TABLET | ORAL | 1 refills | Status: DC

## 2019-04-24 MED ORDER — ARIPIPRAZOLE 2 MG PO TABS: 2.0000 mg | ORAL_TABLET | Freq: Every day | ORAL | 1 refills | Status: DC

## 2019-04-24 NOTE — Progress Notes (Signed)
Psychiatric Initial Adult Assessment   I connected with  Carol Hensley on 04/24/19 by a video enabled telemedicine application and verified that I am speaking with the correct person using two identifiers.   I discussed the limitations of evaluation and management by telemedicine. The patient expressed understanding and agreed to proceed.    Patient Identification: Carol Hensley MRN:  [id number] Date of Evaluation:  04/24/2019   Referral Source: Therapist Felecia Janina Thompson  Chief Complaint:  " I am depressed."  Visit Diagnosis:    ICD-10-CM   1. MDD (major depressive disorder), recurrent episode, moderate (HCC)  F33.1 ARIPiprazole (ABILIFY) 2 MG tablet  2. Anxiety  F41.9     History of Present Illness: This is a 58 year old female with history of depression and anxiety now seen for ongoing depressive symptoms.  Patient reported that she has been really stressed and overwhelmed as her husband was diagnosed with cancer recently.  In addition to that patient has been stressed due to her elderly mother being sick as well. She reported that she is crying most of the time with no energy to do anything.  She also thinks about her husband and her mother's health.  She reported poor sleep and poor appetite.  She stated that she feels nauseous if she tries to eat anything.  She feels like she is not herself and is unable to stay focused on anything.  She has thoughts of life being useless at times however she denied any active suicidal ideations and denied any prior suicide attempts. She thinks she may have lost some weight.  She takes Effexor to 225 mg but does not think it is helping much anymore.  Been on it for more than 3 years now.  She also takes Xanax 0.5 mg in the morning and 2 tablets at bedtime.  Reported taking buspirone 15 mg twice daily but is not sure if it is doing anything.  Patient was agreeable to try addition of Abilify as an augmenting agent to her current regimen.  She  denied any excessive alcohol consumption or substance use. She denied any symptoms of mania or psychosis. She stated her 57 year old daughter lives with them and is supportive.  She is uncertain of her husband's health therefore the family has no plans for Thanksgiving as of yet.   Associated Signs/Symptoms: Depression Symptoms:  See HPI (Hypo) Manic Symptoms:  Denied  Anxiety Symptoms:  Excessive Worry, Psychotic Symptoms:  denied PTSD Symptoms: Negative  Past Psychiatric History: MDD, anxiety  Previous Psychotropic Medications: Yes   Substance Abuse History in the last 12 months:  No.  Consequences of Substance Abuse: Negative  Past Medical History:  Past Medical History:  Diagnosis Date  . Agoraphobia with panic disorder   . Generalized anxiety disorder   . Grief   . History of posttraumatic stress disorder (PTSD)   . Insomnia   . Major depressive disorder, recurrent episode, moderate (HCC)   . Persistent insomnia   . Posttraumatic stress disorder     Past Surgical History:  Procedure Laterality Date  . ABDOMINAL HYSTERECTOMY    . APPENDECTOMY    . COLONOSCOPY WITH PROPOFOL N/A 02/24/2019   Procedure: COLONOSCOPY WITH PROPOFOL;  Surgeon: Christena DeemSkulskie, Martin U, MD;  [facility];  Service: Endoscopy;  Laterality: N/A;  . KIDNEY SURGERY      Family Psychiatric History: father- depression  Family History:  Family History  Problem Relation Age of Onset  . Depression Father   . Alcohol abuse Maternal  Grandmother   . Alcohol abuse Paternal Grandmother   . Depression Paternal Grandmother     Social History:   Social History   Socioeconomic History  . Marital status: Married    Spouse name: Gwyndolyn Saxon  . Number of children: 1  . Years of education: Not on file  . Highest education level: High school graduate  Occupational History  . Not on file  Social Needs  . Financial resource strain: Not hard at all  . Food insecurity    Worry: Never true     Inability: Never true  . Transportation needs    Medical: No    Non-medical: No  Tobacco Use  . Smoking status: Former Research scientist (life sciences)  . Smokeless tobacco: Never Used  Substance and Sexual Activity  . Alcohol use: No  . Drug use: No  . Sexual activity: Yes    Partners: Male    Birth control/protection: None  Lifestyle  . Physical activity    Days per week: 0 days    Minutes per session: 0 min  . Stress: Not on file  Relationships  . Social Herbalist on phone: Not on file    Gets together: Not on file    Attends religious service: 1 to 4 times per year    Active member of club or organization: Yes    Attends meetings of clubs or organizations: 1 to 4 times per year    Relationship status: Married  Other Topics Concern  . Not on file  Social History Narrative  . Not on file    Additional Social History: Lives with husband and 23 y/o daughter  Allergies:   Allergies  Allergen Reactions  . Ciprofloxacin Nausea And Vomiting  . Hydrocodone-Acetaminophen Hives, Nausea And Vomiting and Rash  . Sulfa Antibiotics Rash  . Zolpidem Other (See Comments)    Getting up in the middle of night sleep walking , trying to use appliances , knives etc.  . Zolpidem Tartrate Other (See Comments)    Getting up in the middle of night sleep walking , trying to use appliances , knives etc.  . Compazine  [Prochlorperazine]   . Prochlorperazine Maleate     Muscle spasm  . Sulfamethoxazole   . Sulfur   . Vancomycin Other (See Comments)    Nausea, fatigue, mouth sores.    Metabolic Disorder Labs: No results found for: HGBA1C, MPG No results found for: PROLACTIN No results found for: CHOL, TRIG, HDL, CHOLHDL, VLDL, LDLCALC No results found for: TSH  Therapeutic Level Labs: No results found for: LITHIUM No results found for: CBMZ No results found for: VALPROATE  Current Medications: Current Outpatient Medications  Medication Sig Dispense Refill  . acyclovir ointment (ZOVIRAX) 5 %  APPLY 1 GRAM TOPICALLY EVERY 3 HOURS AS NEEDED    . ALPRAZolam (XANAX) 0.5 MG tablet TAKE 1 TAB IN THE MORNING AND 2 TABS IN THE EVENING    . busPIRone (BUSPAR) 15 MG tablet Take by mouth.    . diphenoxylate-atropine (LOMOTIL) 2.5-0.025 MG per tablet TAKE 1 TABLET BY MOUTH 4 TIMES A DAY AS NEEDED FOR DIARRHEA  5  . estrogens, conjugated, (PREMARIN) 1.25 MG tablet Take by mouth.    . fluconazole (DIFLUCAN) 150 MG tablet TAKE 1 TABLET ONCE FOR YEAST INFECTION MAY REPEAT ONCE IN 3 DAYS IF NEEDED  3  . levalbuterol (XOPENEX HFA) 45 MCG/ACT inhaler Inhale 2 puffs into the lungs every 4 (four) hours as needed for wheezing.    Marland Kitchen  levothyroxine (SYNTHROID) 50 MCG tablet Take by mouth.    . Multiple Vitamins-Minerals (MULTI ADULT GUMMIES) CHEW Chew by mouth.    . nitrofurantoin (MACRODANTIN) 100 MG capsule     . nitrofurantoin, macrocrystal-monohydrate, (MACROBID) 100 MG capsule TAKE 1 CAPSULE (100 MG TOTAL) BY MOUTH 2 (TWO) TIMES DAILY.  0  . phenazopyridine (PYRIDIUM) 100 MG tablet Take 100 mg by mouth.    . potassium citrate (UROCIT-K) 10 MEQ (1080 MG) SR tablet Take by mouth.    . valACYclovir (VALTREX) 500 MG tablet     . venlafaxine (EFFEXOR) 75 MG tablet Take 3 tablets (225 mg total) by mouth at bedtime. 90 tablet 1  . ARIPiprazole (ABILIFY) 2 MG tablet Take 1 tablet (2 mg total) by mouth daily. 30 tablet 1   No current facility-administered medications for this visit.     Musculoskeletal: Strength & Muscle Tone: unable to assess due to telemed visit Gait & Station: unable to assess due to telemed visit Patient leans: unable to assess due to telemed visit  Psychiatric Specialty Exam: ROS  There were no vitals taken for this visit.There is no height or weight on file to calculate BMI.  General Appearance: Fairly Groomed  Eye Contact:  Good  Speech:  Clear and Coherent and Normal Rate  Volume:  Normal  Mood:  Depressed  Affect:  Depressed and Tearful  Thought Process:  Goal Directed,  Linear and Descriptions of Associations: Intact  Orientation:  Full (Time, Place, and Person)  Thought Content:  Logical  Suicidal Thoughts:  No  Homicidal Thoughts:  No  Memory:  Recent;   Good Remote;   Good  Judgement:  Fair  Insight:  Fair  Psychomotor Activity:  Normal  Concentration:  Concentration: Fair and Attention Span: Fair  Recall:  Good  Fund of Knowledge:Good  Language: Good  Akathisia:  Negative  Handed:  Right  AIMS (if indicated):  not done  Assets:  Communication Skills Desire for Improvement Financial Resources/Insurance Housing Social Support Transportation Vocational/Educational  ADL's:  Intact  Cognition: WNL  Sleep:  Poor    Assessment and Plan: 58 year old female with ongoing depressive symptoms in the context of her husband's and mother's poor health.  She is feeling extremely anxious lately and feels her current regimen is not helpful.  She she was agreeable to trial of Abilify augmentation. And trial of Trazodone for sleep Potential side effects of medication and risks vs benefits of treatment vs non-treatment were explained and discussed. All questions were answered.  1. MDD (major depressive disorder), recurrent episode, moderate (HCC) -Continue Effexor 225 mg daily - Start ARIPiprazole (ABILIFY) 2 MG tablet; Take 1 tablet (2 mg total) by mouth daily.  Dispense: 30 tablet; Refill: 1 -Start Trazodone 50 mg HS for sleep  2. Anxiety -Continue Buspar 15 mg BID -Continue Xanax 0.5 mg qam, 1 mg HS.  Continue therapy with Ms. Janee Morn.   Follow-up in 6 weeks.    Zena Amos, MD 10/29/20201:31 PM

## 2019-05-12 ENCOUNTER — Other Ambulatory Visit: Payer: Self-pay

## 2019-05-12 ENCOUNTER — Ambulatory Visit (INDEPENDENT_AMBULATORY_CARE_PROVIDER_SITE_OTHER): Payer: 59 | Admitting: Psychiatry

## 2019-05-12 ENCOUNTER — Ambulatory Visit
Admission: EM | Admit: 2019-05-12 | Discharge: 2019-05-12 | Disposition: A | Discharge status: Home / Self Care | Payer: 59 | Attending: Family Medicine | Admitting: Family Medicine

## 2019-05-12 ENCOUNTER — Encounter: Payer: Self-pay | Admitting: Emergency Medicine

## 2019-05-12 ENCOUNTER — Encounter: Payer: Self-pay | Admitting: Psychiatry

## 2019-05-12 DIAGNOSIS — R519 Headache, unspecified: Secondary | ICD-10-CM | POA: Diagnosis not present

## 2019-05-12 DIAGNOSIS — R0602 Shortness of breath: Secondary | ICD-10-CM

## 2019-05-12 DIAGNOSIS — B349 Viral infection, unspecified: Secondary | ICD-10-CM

## 2019-05-12 DIAGNOSIS — R05 Cough: Secondary | ICD-10-CM | POA: Diagnosis not present

## 2019-05-12 DIAGNOSIS — F419 Anxiety disorder, unspecified: Secondary | ICD-10-CM

## 2019-05-12 DIAGNOSIS — F331 Major depressive disorder, recurrent, moderate: Secondary | ICD-10-CM

## 2019-05-12 DIAGNOSIS — R110 Nausea: Secondary | ICD-10-CM

## 2019-05-12 MED ORDER — BENZONATATE 100 MG PO CAPS: 100.0000 mg | ORAL_CAPSULE | Freq: Three times a day (TID) | ORAL | 0 refills | Status: DC | PRN

## 2019-05-12 NOTE — ED Triage Notes (Signed)
Pt c/i diarrhea, headache, nausea, body aches, shortness of breath, cough, loss od smell and taste and nasal congestion, started about a week ago. Pt states that her husband and daughter which live in her household recently tested positive for COVID.

## 2019-05-12 NOTE — ED Provider Notes (Signed)
[facility]    CSN: [id number] Arrival date & time: 05/12/19  1330      History   Chief Complaint Chief Complaint  Patient presents with   Cough   Shortness of Breath   Headache    HPI Carol Hensley is a 58 y.o. female.   57 yo female with a c/o body aches, cough, headaches, nausea, loss of taste and smell for the past 5 days. Denies any fevers, chills. States her daughter and husband tested positive for covid yesterday and today.    Cough Associated symptoms: headaches and shortness of breath   Shortness of Breath Associated symptoms: cough and headaches   Headache Associated symptoms: cough     Past Medical History:  Diagnosis Date   Agoraphobia with panic disorder    Generalized anxiety disorder    Grief    History of posttraumatic stress disorder (PTSD)    Insomnia    Major depressive disorder, recurrent episode, moderate (HCC)    Persistent insomnia    Posttraumatic stress disorder     Patient Active Problem List   Diagnosis Date Noted   Stye 04/24/2019   Acquired hypothyroidism 12/24/2018   Chronic pancreatitis (HCC) 12/24/2018   Hypercholesteremia 12/24/2018   IBS (irritable bowel syndrome) 12/24/2018   Major depressive disorder, recurrent, in remission (HCC) 12/24/2018   Urinary tract infection, site not specified 12/24/2018   Hearing loss of right ear due to cerumen impaction 12/29/2015   Chronic left shoulder pain 05/26/2015   Right shoulder pain 05/05/2015   Neck pain on left side 04/21/2015   H/O: depression 12/21/2014   History of digestive disease 12/21/2014   BP (high blood pressure) 12/21/2014   Acquired polyneuropathy 12/21/2014   Nonspecific vaginitis 12/21/2014   Boil of eyelid 12/21/2014   Frequent UTI 12/21/2014   MDD (major depressive disorder), recurrent episode, moderate (HCC) 12/18/2014   Panic disorder with agoraphobia and severe panic attacks 12/18/2014   Grief 12/18/2014    Coma (HCC) 10/02/2014   CAFL (chronic airflow limitation) (HCC) 10/02/2014   Depression, major, recurrent, moderate (HCC) 10/02/2014   Anxiety, generalized 10/02/2014   Sex counseling 10/02/2014   Aggrieved 10/02/2014   H/O anxiety state 10/02/2014   Insomnia, persistent 10/02/2014   Cannot sleep 10/02/2014   H/O renal calculi 10/02/2014   Major depressive disorder, recurrent severe without psychotic features (HCC) 10/02/2014   Agoraphobia with panic disorder 10/02/2014   Panic disorder without agoraphobia with moderate panic attacks 10/02/2014   H/O: pneumonia 10/02/2014   Neurosis, posttraumatic 10/02/2014   Adjustment disorder with depressed mood 10/02/2014   Personal history of poisoning, presenting hazards to health 10/02/2014   H/O urinary stone 10/02/2014   Other specified personal risk factors, not elsewhere classified 10/02/2014   Asthma, mild intermittent 08/06/2014   Post viral syndrome 06/25/2014   Poisoning by acidifying agent 06/25/2014   Weight gain due to medication 06/25/2014   Extrapyramidal and movement disorder 01/26/2014   Concussion injury of body structure 10/30/2013   Accidental fall from furniture 10/30/2013   Lacerated 10/30/2013   Blood in feces 10/22/2013   Pain in rectum 10/22/2013   Hematochezia 10/22/2013   Essential (primary) hypertension 03/23/2013   Bacterial pneumonia 02/26/2013   Atypical chest pain 02/26/2013   Chest pain 02/26/2013   CAP (community acquired pneumonia) 02/26/2013   Disorder of magnesium metabolism 02/26/2013   Hypomagnesemia 02/26/2013   Calculus of kidney 02/26/2013   Hypostatic pulmonary congestion 02/26/2013   Breath shortness 02/26/2013   Lung  edema 02/26/2013   Anxiety 11/30/2012   Panic attack 11/30/2012    Past Surgical History:  Procedure Laterality Date   ABDOMINAL HYSTERECTOMY     APPENDECTOMY     COLONOSCOPY WITH PROPOFOL N/A 02/24/2019   Procedure:  COLONOSCOPY WITH PROPOFOL;  Surgeon: Christena DeemSkulskie, Martin U, MD;  [facility];  Service: Endoscopy;  Laterality: N/A;   KIDNEY SURGERY      OB History   No obstetric history on file.      Home Medications    Prior to Admission medications   Medication Sig Start Date End Date Taking? Authorizing Provider  ALPRAZolam (XANAX) 0.5 MG tablet TAKE 1 TAB IN THE MORNING AND 2 TABS IN THE EVENING 05/31/14  Yes [provider]  ARIPiprazole (ABILIFY) 2 MG tablet Take 1 tablet (2 mg total) by mouth daily. 04/24/19  Yes Zena AmosKaur, Mandeep, MD  busPIRone (BUSPAR) 15 MG tablet Take by mouth. 04/10/19 04/09/20 Yes [provider]  diphenoxylate-atropine (LOMOTIL) 2.5-0.025 MG per tablet TAKE 1 TABLET BY MOUTH 4 TIMES A DAY AS NEEDED FOR DIARRHEA 11/05/14  Yes [provider]  estrogens, conjugated, (PREMARIN) 1.25 MG tablet Take by mouth. 10/13/18  Yes [provider]  levalbuterol (XOPENEX HFA) 45 MCG/ACT inhaler Inhale 2 puffs into the lungs every 4 (four) hours as needed for wheezing.   Yes [provider]  levothyroxine (SYNTHROID) 50 MCG tablet Take by mouth. 05/10/18  Yes [provider]  Multiple Vitamins-Minerals (MULTI ADULT GUMMIES) CHEW Chew by mouth.   Yes [provider]  potassium citrate (UROCIT-K) 10 MEQ (1080 MG) SR tablet Take by mouth. 03/04/19  Yes [provider]  traZODone (DESYREL) 50 MG tablet Take one tablet at bedtime for sleep 04/24/19  Yes Zena AmosKaur, Mandeep, MD  valACYclovir (VALTREX) 500 MG tablet  10/21/14  Yes [provider]  venlafaxine (EFFEXOR) 75 MG tablet Take 3 tablets (225 mg total) by mouth at bedtime. 02/08/17  Yes Ravi, Himabindu, MD  acyclovir ointment (ZOVIRAX) 5 % APPLY 1 GRAM TOPICALLY EVERY 3 HOURS AS NEEDED 09/30/18   [provider]  benzonatate (TESSALON) 100 MG capsule Take 1 capsule (100 mg total) by mouth 3 (three) times daily as needed. 05/12/19   Payton Mccallumonty, Caroly Purewal, MD    fluconazole (DIFLUCAN) 150 MG tablet TAKE 1 TABLET ONCE FOR YEAST INFECTION MAY REPEAT ONCE IN 3 DAYS IF NEEDED 10/21/14   [provider]  nitrofurantoin (MACRODANTIN) 100 MG capsule  10/21/14   [provider]  nitrofurantoin, macrocrystal-monohydrate, (MACROBID) 100 MG capsule TAKE 1 CAPSULE (100 MG TOTAL) BY MOUTH 2 (TWO) TIMES DAILY. 10/02/14   [provider]  phenazopyridine (PYRIDIUM) 100 MG tablet Take 100 mg by mouth. 11/24/13   [provider]    Family History Family History  Problem Relation Age of Onset   Depression Father    Alcohol abuse Maternal Grandmother    Alcohol abuse Paternal Grandmother    Depression Paternal Grandmother     Social History Social History   Tobacco Use   Smoking status: Former Smoker   Smokeless tobacco: Never Used  Substance Use Topics   Alcohol use: No   Drug use: No     Allergies   Ciprofloxacin, Hydrocodone-acetaminophen, Sulfa antibiotics, Zolpidem, Zolpidem tartrate, Compazine  [prochlorperazine], Prochlorperazine maleate, Sulfamethoxazole, Sulfur, and Vancomycin   Review of Systems Review of Systems  Respiratory: Positive for cough and shortness of breath.   Neurological: Positive for headaches.     Physical Exam Triage Vital Signs ED  Triage Vitals  Enc Vitals Group     BP 05/12/19 1352 (!) 119/59     Pulse Rate 05/12/19 1352 100     Resp 05/12/19 1352 20     Temp 05/12/19 1352 98.2 F (36.8 C)     Temp Source 05/12/19 1352 Oral     SpO2 05/12/19 1352 99 %     Weight 05/12/19 1348 116 lb (52.6 kg)     Height 05/12/19 1348 5' (1.524 m)     Head Circumference --      Peak Flow --      Pain Score 05/12/19 1347 6     Pain Loc --      Pain Edu? --      Excl. in [place]? --    No data found.  Updated Vital Signs BP (!) 119/59 (BP Location: Right Arm)    Pulse 100    Temp 98.2 F (36.8 C) (Oral)    Resp 20    Ht 5' (1.524 m)    Wt 52.6 kg    SpO2 99%    BMI 22.65 kg/m   Visual  Acuity Right Eye Distance:   Left Eye Distance:   Bilateral Distance:    Right Eye Near:   Left Eye Near:    Bilateral Near:     Physical Exam Vitals signs and nursing note reviewed.  Constitutional:      General: She is not in acute distress.    Appearance: She is not toxic-appearing or diaphoretic.  Cardiovascular:     Rate and Rhythm: Normal rate.  Pulmonary:     Effort: Pulmonary effort is normal. No respiratory distress.  Neurological:     Mental Status: She is alert.      UC Treatments / Results  Labs (all labs ordered are listed, but only abnormal results are displayed) Labs Reviewed  NOVEL CORONAVIRUS, NAA (HOSP ORDER, SEND-OUT TO REF LAB; TAT 18-24 HRS)    EKG   Radiology No results found.  Procedures Procedures (including critical care time)  Medications Ordered in UC Medications - No data to display  Initial Impression / Assessment and Plan / UC Course  I have reviewed the triage vital signs and the nursing notes.  Pertinent labs & imaging results that were available during my care of the patient were reviewed by me and considered in my medical decision making (see chart for details).      Final Clinical Impressions(s) / UC Diagnoses   Final diagnoses:  Viral syndrome  Nausea without vomiting    ED Prescriptions    Medication Sig Dispense Auth. Provider   benzonatate (TESSALON) 100 MG capsule Take 1 capsule (100 mg total) by mouth 3 (three) times daily as needed. 21 capsule Norval Gable, MD      1. diagnosis reviewed with patient 2. rx as per orders above; reviewed possible side effects, interactions, risks and benefits 3. covid test done  4. Recommend supportive treatment with rest, fluids, otc analgesics prn, self quarantine 5. Follow-up prn   PDMP not reviewed this encounter.   Norval Gable, MD 05/12/19 [phone]

## 2019-05-12 NOTE — Progress Notes (Signed)
[facility] MD OP Progress Note  I connected with  Carol Hensley on 05/12/19 by a video enabled telemedicine application and verified that I am speaking with the correct person using two identifiers.   I discussed the limitations of evaluation and management by telemedicine. The patient expressed understanding and agreed to proceed.   05/12/2019 4:00 PM [name]  MRN:  [id number]  Chief Complaint:  " I am not doing well."  HPI: Pt reported that she has not been doing well. She started Abilify with effexor but could not tell a difference. She started to feel physically sick a few days ago and had COVId testing done this morning. She just found that she has tested positive for COVID. She stated that she is feeling sick and exhausted physically. She continues to deal with ongoing stress about her husband's sickness. He was diagnosed with cancer recently. I recommended her to discontinue Abili due to lack of efficacy and the plan to taper off Effexor when she feels physically better by next week was discussed. Pt verbalized her understanding.  Visit Diagnosis:    ICD-10-CM   1. MDD (major depressive disorder), recurrent episode, moderate (HCC)  F33.1   2. Anxiety  F41.9      Past Medical History:  Past Medical History:  Diagnosis Date  . Agoraphobia with panic disorder   . Generalized anxiety disorder   . Grief   . History of posttraumatic stress disorder (PTSD)   . Insomnia   . Major depressive disorder, recurrent episode, moderate ([place])   . Persistent insomnia   . Posttraumatic stress disorder     Past Surgical History:  Procedure Laterality Date  . ABDOMINAL HYSTERECTOMY    . APPENDECTOMY    . COLONOSCOPY WITH PROPOFOL N/A 02/24/2019   Procedure: COLONOSCOPY WITH PROPOFOL;  Surgeon: Lollie Sails, MD;  Location: [facility] ENDOSCOPY;  Service: Endoscopy;  Laterality: N/A;  . KIDNEY SURGERY       Family History:  Family History  Problem Relation Age of Onset  .  Depression Father   . Alcohol abuse Maternal Grandmother   . Alcohol abuse Paternal Grandmother   . Depression Paternal Grandmother     Social History:  Social History   Socioeconomic History  . Marital status: Married    Spouse name: Carol Hensley  . Number of children: 1  . Years of education: Not on file  . Highest education level: High school graduate  Occupational History  . Not on file  Social Needs  . Financial resource strain: Not hard at all  . Food insecurity    Worry: Never true    Inability: Never true  . Transportation needs    Medical: No    Non-medical: No  Tobacco Use  . Smoking status: Former Research scientist (life sciences)  . Smokeless tobacco: Never Used  Substance and Sexual Activity  . Alcohol use: No  . Drug use: No  . Sexual activity: Yes    Partners: Male    Birth control/protection: None  Lifestyle  . Physical activity    Days per week: 0 days    Minutes per session: 0 min  . Stress: Not on file  Relationships  . Social Herbalist on phone: Not on file    Gets together: Not on file    Attends religious service: 1 to 4 times per year    Active member of club or organization: Yes    Attends meetings of clubs or organizations: 1 to 4 times  per year    Relationship status: Married  Other Topics Concern  . Not on file  Social History Narrative  . Not on file    Allergies:  Allergies  Allergen Reactions  . Ciprofloxacin Nausea And Vomiting  . Hydrocodone-Acetaminophen Hives, Nausea And Vomiting and Rash  . Sulfa Antibiotics Rash  . Zolpidem Other (See Comments)    Getting up in the middle of night sleep walking , trying to use appliances , knives etc.  . Zolpidem Tartrate Other (See Comments)    Getting up in the middle of night sleep walking , trying to use appliances , knives etc.  . Compazine  [Prochlorperazine]   . Prochlorperazine Maleate     Muscle spasm  . Sulfamethoxazole   . Sulfur   . Vancomycin Other (See Comments)    Nausea, fatigue,  mouth sores.    Metabolic Disorder Labs: No results found for: HGBA1C, MPG No results found for: PROLACTIN No results found for: CHOL, TRIG, HDL, CHOLHDL, VLDL, LDLCALC No results found for: TSH  Therapeutic Level Labs: No results found for: LITHIUM No results found for: VALPROATE No components found for:  CBMZ  Current Medications: Current Outpatient Medications  Medication Sig Dispense Refill  . acyclovir ointment (ZOVIRAX) 5 % APPLY 1 GRAM TOPICALLY EVERY 3 HOURS AS NEEDED    . ALPRAZolam (XANAX) 0.5 MG tablet TAKE 1 TAB IN THE MORNING AND 2 TABS IN THE EVENING    . ARIPiprazole (ABILIFY) 2 MG tablet Take 1 tablet (2 mg total) by mouth daily. 30 tablet 1  . benzonatate (TESSALON) 100 MG capsule Take 1 capsule (100 mg total) by mouth 3 (three) times daily as needed. 21 capsule 0  . busPIRone (BUSPAR) 15 MG tablet Take by mouth.    . diphenoxylate-atropine (LOMOTIL) 2.5-0.025 MG per tablet TAKE 1 TABLET BY MOUTH 4 TIMES A DAY AS NEEDED FOR DIARRHEA  5  . estrogens, conjugated, (PREMARIN) 1.25 MG tablet Take by mouth.    . fluconazole (DIFLUCAN) 150 MG tablet TAKE 1 TABLET ONCE FOR YEAST INFECTION MAY REPEAT ONCE IN 3 DAYS IF NEEDED  3  . levalbuterol (XOPENEX HFA) 45 MCG/ACT inhaler Inhale 2 puffs into the lungs every 4 (four) hours as needed for wheezing.    Marland Kitchen. levothyroxine (SYNTHROID) 50 MCG tablet Take by mouth.    . Multiple Vitamins-Minerals (MULTI ADULT GUMMIES) CHEW Chew by mouth.    . nitrofurantoin (MACRODANTIN) 100 MG capsule     . nitrofurantoin, macrocrystal-monohydrate, (MACROBID) 100 MG capsule TAKE 1 CAPSULE (100 MG TOTAL) BY MOUTH 2 (TWO) TIMES DAILY.  0  . phenazopyridine (PYRIDIUM) 100 MG tablet Take 100 mg by mouth.    . potassium citrate (UROCIT-K) 10 MEQ (1080 MG) SR tablet Take by mouth.    . traZODone (DESYREL) 50 MG tablet Take one tablet at bedtime for sleep 30 tablet 1  . valACYclovir (VALTREX) 500 MG tablet     . venlafaxine (EFFEXOR) 75 MG tablet Take 3  tablets (225 mg total) by mouth at bedtime. 90 tablet 1   No current facility-administered medications for this visit.      Psychiatric Specialty Exam: ROS  There were no vitals taken for this visit.There is no height or weight on file to calculate BMI.  General Appearance: Fairly Groomed  Eye Contact:  Good  Speech:  Clear and Coherent and Normal Rate  Volume:  Normal  Mood:  Depressed  Affect:  Congruent  Thought Process:  Goal Directed, Linear and Descriptions of  Associations: Intact  Orientation:  Full (Time, Place, and Person)  Thought Content: Logical   Suicidal Thoughts:  No  Homicidal Thoughts:  No  Memory:  Recent;   Good Remote;   Good  Judgement:  Fair  Insight:  Fair  Psychomotor Activity:  Normal  Concentration:  Concentration: Good and Attention Span: Good  Recall:  Good  Fund of Knowledge: Good  Language: Good  Akathisia:  Negative  Handed:  Right  AIMS (if indicated):not done  Assets:  Communication Skills Desire for Improvement Financial Resources/Insurance Housing Transportation  ADL's:  Intact  Cognition: WNL  Sleep:  Fair    Assessment and Plan: 58 y/o female who continues to feel depressed due to husband's recent diagnosis of cancer. She was diagnosed with COVID infection earlier today and is feeling physically exhausted.   1. MDD (major depressive disorder), recurrent episode, moderate (HCC)  The plan of discontinuing Abilify due to lack of efficacy was discussed. Continue Effexor 225 mg daily. Plan is to cross titrate Effexor with viibryd starting next week when pt feels better and recovered due to her COVId infection.  2. Anxiety Continue Buspar and Xanax for now.  F/up next week.  Zena Amos, MD 05/12/2019, 4:00 PM

## 2019-05-13 ENCOUNTER — Ambulatory Visit: Payer: Self-pay | Admitting: Urology

## 2019-05-13 LAB — NOVEL CORONAVIRUS, NAA (HOSP ORDER, SEND-OUT TO REF LAB; TAT 18-24 HRS): SARS-CoV-2, NAA: DETECTED — AB

## 2019-05-14 ENCOUNTER — Telehealth (HOSPITAL_COMMUNITY): Payer: Self-pay | Admitting: Emergency Medicine

## 2019-05-14 NOTE — Telephone Encounter (Signed)
Positive covid detected on sample, Patient contacted and made aware of    results. Pt verbalized understanding and had all questions answered.

## 2019-05-21 ENCOUNTER — Other Ambulatory Visit: Payer: Self-pay

## 2019-05-21 ENCOUNTER — Encounter: Payer: Self-pay | Admitting: Psychiatry

## 2019-05-21 ENCOUNTER — Ambulatory Visit (INDEPENDENT_AMBULATORY_CARE_PROVIDER_SITE_OTHER): Payer: 59 | Admitting: Psychiatry

## 2019-05-21 DIAGNOSIS — F331 Major depressive disorder, recurrent, moderate: Secondary | ICD-10-CM

## 2019-05-21 DIAGNOSIS — F419 Anxiety disorder, unspecified: Secondary | ICD-10-CM

## 2019-05-21 MED ORDER — VENLAFAXINE HCL 75 MG PO TABS: 225.0000 mg | ORAL_TABLET | Freq: Every day | ORAL | 1 refills | Status: DC

## 2019-05-21 MED ORDER — TRAZODONE HCL 100 MG PO TABS: 100.0000 mg | ORAL_TABLET | Freq: Every day | ORAL | 1 refills | Status: DC

## 2019-05-21 MED ORDER — BUSPIRONE HCL 15 MG PO TABS: 15.0000 mg | ORAL_TABLET | Freq: Two times a day (BID) | ORAL | 1 refills | Status: DC

## 2019-05-21 NOTE — Progress Notes (Signed)
BH MD OP Progress Note  I connected with  Carol Hensley on 05/21/19 by a video enabled telemedicine application and verified that I am speaking with the correct person using two identifiers.   I discussed the limitations of evaluation and management by telemedicine. The patient expressed understanding and agreed to proceed.    05/21/2019 8:39 AM Carol Hensley  MRN:  [id number]  Chief Complaint: " I am better but I can't sleep at night."  HPI: Patient was seen last week after she called and reported she was not doing well after starting Abilify.  Last week patient was diagnosed with COVID-19 and therefore was having some physical symptoms related to the infection.  Writer had recommended touching base the following week after she recovered physically so that we can discuss any further medication changes as needed.  Today patient reported that she wants to continue taking the Effexor for now and does not want to change the Effexor at this point.  She asked if she could go back to taking Abilify but then was not sure.  She reported that her husband is starting cancer treatment and she will be occupied with that over the next few weeks.  Patient informed that she is not sleeping well and that is her biggest challenge right now.  Patient was agreeable to trying higher dose of trazodone 100 mg at bedtime for sleep.  She reported feeling anxious.  Writer recommended her taking Xanax 0.5 mg 3 times a day instead of taking 1 tablet in the morning and 2 tablets at night.  Patient was agreeable with this plan.  She had an prescheduled appointment in about 2 weeks from now but she was agreeable to changing that appointment to end of December. Patient seemed less anxious and less depressed today and appeared to be doing well physically.  Visit Diagnosis:    ICD-10-CM   1. MDD (major depressive disorder), recurrent episode, moderate (HCC)  F33.1 venlafaxine (EFFEXOR) 75 MG tablet    busPIRone (BUSPAR)  15 MG tablet    traZODone (DESYREL) 100 MG tablet  2. Anxiety  F41.9 venlafaxine (EFFEXOR) 75 MG tablet    busPIRone (BUSPAR) 15 MG tablet    Past Psychiatric History: Anxiety, MDD  Past Medical History:  Past Medical History:  Diagnosis Date  . Agoraphobia with panic disorder   . Generalized anxiety disorder   . Grief   . History of posttraumatic stress disorder (PTSD)   . Insomnia   . Major depressive disorder, recurrent episode, moderate (HCC)   . Persistent insomnia   . Posttraumatic stress disorder     Past Surgical History:  Procedure Laterality Date  . ABDOMINAL HYSTERECTOMY    . APPENDECTOMY    . COLONOSCOPY WITH PROPOFOL N/A 02/24/2019   Procedure: COLONOSCOPY WITH PROPOFOL;  Surgeon: Christena DeemSkulskie, Martin U, MD;  Location: [facility] END[facility];  Service: Endoscopy;  Laterality: N/A;  . KIDNEY SURGERY      Family Psychiatric History: see below  Family History:  Family History  Problem Relation Age of Onset  . Depression Father   . Alcohol abuse Maternal Grandmother   . Alcohol abuse Paternal Grandmother   . Depression Paternal Grandmother     Social History:  Social History   Socioeconomic History  . Marital status: Married    Spouse name: Carol Hensley  . Number of children: 1  . Years of education: Not on file  . Highest education level: High school graduate  Occupational History  . Not on file  Social Needs  . Financial resource strain: Not hard at all  . Food insecurity    Worry: Never true    Inability: Never true  . Transportation needs    Medical: No    Non-medical: No  Tobacco Use  . Smoking status: Former Games developer  . Smokeless tobacco: Never Used  Substance and Sexual Activity  . Alcohol use: No  . Drug use: No  . Sexual activity: Yes    Partners: Male    Birth control/protection: None  Lifestyle  . Physical activity    Days per week: 0 days    Minutes per session: 0 min  . Stress: Not on file  Relationships  . Social Musician on  phone: Not on file    Gets together: Not on file    Attends religious service: 1 to 4 times per year    Active member of club or organization: Yes    Attends meetings of clubs or organizations: 1 to 4 times per year    Relationship status: Married  Other Topics Concern  . Not on file  Social History Narrative  . Not on file    Allergies:  Allergies  Allergen Reactions  . Ciprofloxacin Nausea And Vomiting  . Hydrocodone-Acetaminophen Hives, Nausea And Vomiting and Rash  . Sulfa Antibiotics Rash  . Zolpidem Other (See Comments)    Getting up in the middle of night sleep walking , trying to use appliances , knives etc.  . Zolpidem Tartrate Other (See Comments)    Getting up in the middle of night sleep walking , trying to use appliances , knives etc.  . Compazine  [Prochlorperazine]   . Prochlorperazine Maleate     Muscle spasm  . Sulfamethoxazole   . Sulfur   . Vancomycin Other (See Comments)    Nausea, fatigue, mouth sores.    Metabolic Disorder Labs: No results found for: HGBA1C, MPG No results found for: PROLACTIN No results found for: CHOL, TRIG, HDL, CHOLHDL, VLDL, LDLCALC No results found for: TSH  Therapeutic Level Labs: No results found for: LITHIUM No results found for: VALPROATE No components found for:  CBMZ  Current Medications: Current Outpatient Medications  Medication Sig Dispense Refill  . acyclovir ointment (ZOVIRAX) 5 % APPLY 1 GRAM TOPICALLY EVERY 3 HOURS AS NEEDED    . ALPRAZolam (XANAX) 0.5 MG tablet TAKE 1 TAB IN THE MORNING AND 2 TABS IN THE EVENING    . benzonatate (TESSALON) 100 MG capsule Take 1 capsule (100 mg total) by mouth 3 (three) times daily as needed. 21 capsule 0  . busPIRone (BUSPAR) 15 MG tablet Take 1 tablet (15 mg total) by mouth 2 (two) times daily. 60 tablet 1  . diphenoxylate-atropine (LOMOTIL) 2.5-0.025 MG per tablet TAKE 1 TABLET BY MOUTH 4 TIMES A DAY AS NEEDED FOR DIARRHEA  5  . estrogens, conjugated, (PREMARIN) 1.25 MG  tablet Take by mouth.    . fluconazole (DIFLUCAN) 150 MG tablet TAKE 1 TABLET ONCE FOR YEAST INFECTION MAY REPEAT ONCE IN 3 DAYS IF NEEDED  3  . levalbuterol (XOPENEX HFA) 45 MCG/ACT inhaler Inhale 2 puffs into the lungs every 4 (four) hours as needed for wheezing.    Marland Kitchen levothyroxine (SYNTHROID) 50 MCG tablet Take by mouth.    . Multiple Vitamins-Minerals (MULTI ADULT GUMMIES) CHEW Chew by mouth.    . nitrofurantoin (MACRODANTIN) 100 MG capsule     . nitrofurantoin, macrocrystal-monohydrate, (MACROBID) 100 MG capsule TAKE 1 CAPSULE (100 MG  TOTAL) BY MOUTH 2 (TWO) TIMES DAILY.  0  . phenazopyridine (PYRIDIUM) 100 MG tablet Take 100 mg by mouth.    . potassium citrate (UROCIT-K) 10 MEQ (1080 MG) SR tablet Take by mouth.    . traZODone (DESYREL) 100 MG tablet Take 1 tablet (100 mg total) by mouth at bedtime. 30 tablet 1  . valACYclovir (VALTREX) 500 MG tablet     . venlafaxine (EFFEXOR) 75 MG tablet Take 3 tablets (225 mg total) by mouth at bedtime. 90 tablet 1   No current facility-administered medications for this visit.     Musculoskeletal: Strength & Muscle Tone: unable to assess due to telemed visit Gait & Station: unable to assess due to telemed visit Patient leans: unable to assess due to telemed visit  Psychiatric Specialty Exam: ROS  There were no vitals taken for this visit.There is no height or weight on file to calculate BMI.  General Appearance: Fairly Groomed  Eye Contact:  Good  Speech:  Clear and Coherent and Normal Rate  Volume:  Normal  Mood:  Less anxious and less depressed  Affect:  Congruent  Thought Process:  Goal Directed, Linear and Descriptions of Associations: Intact  Orientation:  Full (Time, Place, and Person)  Thought Content: Logical   Suicidal Thoughts:  No  Homicidal Thoughts:  No  Memory:  Recent;   Good Remote;   Good  Judgement:  Fair  Insight:  Fair  Psychomotor Activity:  Normal  Concentration:  Concentration: Good and Attention Span: Good   Recall:  Good  Fund of Knowledge: Good  Language: Good  Akathisia:  Negative  Handed:  Right  AIMS (if indicated): not done  Assets:  Communication Skills Desire for Improvement Financial Resources/Insurance Housing Social Support Talents/Skills Transportation Vocational/Educational  ADL's:  Intact  Cognition: WNL  Sleep:  Poor      Assessment and Plan: 58 year old female seen for follow-up after a session last week for ongoing depressive and anxiety symptoms.  Plan was to cross titrate Effexor with another medication after she recovered from Covid.  Today patient stated that she wants to continue Effexor and reported poor sleep.  The dose of trazodone was increased for optimal effect.  1. MDD (major depressive disorder), recurrent episode, moderate (HCC)  -Continue venlafaxine (EFFEXOR) 75 MG tablet; Take 3 tablets (225 mg total) by mouth at bedtime.  Dispense: 90 tablet; Refill: 1 -Continue busPIRone (BUSPAR) 15 MG tablet; Take 1 tablet (15 mg total) by mouth 2 (two) times daily.  Dispense: 60 tablet; Refill: 1 -Increase traZODone (DESYREL) 100 MG tablet; Take 1 tablet (100 mg total) by mouth at bedtime.  Dispense: 30 tablet; Refill: 1  2. Anxiety  - venlafaxine (EFFEXOR) 75 MG tablet; Take 3 tablets (225 mg total) by mouth at bedtime.  Dispense: 90 tablet; Refill: 1 - busPIRone (BUSPAR) 15 MG tablet; Take 1 tablet (15 mg total) by mouth 2 (two) times daily.  Dispense: 60 tablet; Refill: 1 - Advised to take Xanax 0.5 mg TID instead of 0.5 mg qam and 1 mg HS.  F/up in 51month.   [place] Crane, MD 05/21/2019, 8:39 AM

## 2019-05-26 ENCOUNTER — Telehealth: Payer: Self-pay

## 2019-05-26 DIAGNOSIS — F331 Major depressive disorder, recurrent, moderate: Secondary | ICD-10-CM

## 2019-05-26 DIAGNOSIS — F419 Anxiety disorder, unspecified: Secondary | ICD-10-CM

## 2019-05-26 MED ORDER — VENLAFAXINE HCL 75 MG PO TABS: 225.0000 mg | ORAL_TABLET | Freq: Every day | ORAL | 0 refills | Status: DC

## 2019-05-26 MED ORDER — BUSPIRONE HCL 15 MG PO TABS: 15.0000 mg | ORAL_TABLET | Freq: Two times a day (BID) | ORAL | 0 refills | Status: DC

## 2019-05-26 MED ORDER — TRAZODONE HCL 100 MG PO TABS: 100.0000 mg | ORAL_TABLET | Freq: Every day | ORAL | 0 refills | Status: DC

## 2019-05-26 NOTE — Telephone Encounter (Signed)
pt called states she needs a short term [facility]ax and then [facility]

## 2019-05-26 NOTE — Telephone Encounter (Signed)
[facility] wanted to know if you can send all rxs

## 2019-05-26 NOTE — Telephone Encounter (Signed)
Okay, [facility]

## 2019-05-27 ENCOUNTER — Telehealth: Payer: Self-pay

## 2019-05-27 NOTE — Telephone Encounter (Signed)
Pt.notified

## 2019-05-27 NOTE — Telephone Encounter (Signed)
Our clinic has never prescribed any xanax to her. Her PCP Dr. Ouida Sills has been prescribing it to her. She needs to contact her PCP office.

## 2019-05-28 MED ORDER — CLONAZEPAM 0.5 MG PO TABS: 0.5000 mg | ORAL_TABLET | Freq: Two times a day (BID) | ORAL | 1 refills | Status: DC | PRN

## 2019-05-28 MED ORDER — VENLAFAXINE HCL ER 75 MG PO CP24: ORAL_CAPSULE | ORAL | 1 refills | Status: DC

## 2019-05-28 NOTE — Telephone Encounter (Signed)
pt called left message that she is not feeling very well with the medication changes and wanted to speak with the doctor.

## 2019-05-28 NOTE — Addendum Note (Signed)
Addended by: [place] Crane on: 05/28/2019 08:50 AM   Modules accepted: Orders

## 2019-05-28 NOTE — Telephone Encounter (Signed)
I called and spoke with the pt. She informed that she is distraught as her had to be hospitalized last night.  He has been diagnosed with Covid and he was also found to have more metastatic areas on his lungs. Patient stated that she had contacted her PCP Dr. Ouida Sills for refills on Xanax however he declined to refill them and directed her to contact our office. Patient informed she is completely out of Xanax and is feeling very anxious and extremely worried about her husband. Patient was recommended switching to clonazepam for longer duration of action compared to Xanax.  Patient stated she has heard of this medication and is willing to try it. Prescription for Klonopin 0.5 mg twice daily was sent to her preferred local pharmacy.

## 2019-06-03 ENCOUNTER — Ambulatory Visit: Payer: 59 | Admitting: Psychiatry

## 2019-06-25 ENCOUNTER — Encounter: Payer: Self-pay | Admitting: Psychiatry

## 2019-06-25 ENCOUNTER — Ambulatory Visit (INDEPENDENT_AMBULATORY_CARE_PROVIDER_SITE_OTHER): Payer: 59 | Admitting: Psychiatry

## 2019-06-25 ENCOUNTER — Other Ambulatory Visit: Payer: Self-pay

## 2019-06-25 DIAGNOSIS — F419 Anxiety disorder, unspecified: Secondary | ICD-10-CM | POA: Diagnosis not present

## 2019-06-25 DIAGNOSIS — F3341 Major depressive disorder, recurrent, in partial remission: Secondary | ICD-10-CM

## 2019-06-25 MED ORDER — CLONAZEPAM 0.5 MG PO TABS: 0.5000 mg | ORAL_TABLET | Freq: Two times a day (BID) | ORAL | 1 refills | Status: DC | PRN

## 2019-06-25 MED ORDER — TRAZODONE HCL 150 MG PO TABS: 150.0000 mg | ORAL_TABLET | Freq: Every day | ORAL | 0 refills | Status: DC

## 2019-06-25 NOTE — Progress Notes (Signed)
BH MD OP Progress Note  I connected with  Carol OarCynthia P Balandran on 06/25/19 by a video enabled telemedicine application and verified that I am speaking with the correct person using two identifiers.   I discussed the limitations of evaluation and management by telemedicine. The patient expressed understanding and agreed to proceed.    06/25/2019 9:56 AM Carol OarCynthia P Hensley  MRN:  [id number]  Chief Complaint: " I am all right as much as I can be."  HPI: Patient stated that she is doing as well as she could.  Her husband is still not doing too well.  He was hospitalized for Covid infection a few weeks ago.  Patient informed that he did not need to be transferred to the ICU and was discharged home.  He is battling with lung cancer with metastasis.  He is currently undergoing chemotherapy for the same.  He is at home right now patient and her daughter are trying to take care of him as much as he can.  Patient still worries about him and is not able to sleep well at night despite 100 mg trazodone.  She feels clonazepam has helped her anxiety and she would like to continue it for now.  Visit Diagnosis:    ICD-10-CM   1. MDD (major depressive disorder), recurrent, in partial remission (HCC)  F33.41   2. Anxiety  F41.9     Past Psychiatric History: MDD, anxiety  Past Medical History:  Past Medical History:  Diagnosis Date  . Agoraphobia with panic disorder   . Generalized anxiety disorder   . Grief   . History of posttraumatic stress disorder (PTSD)   . Insomnia   . Major depressive disorder, recurrent episode, moderate (HCC)   . Persistent insomnia   . Posttraumatic stress disorder     Past Surgical History:  Procedure Laterality Date  . ABDOMINAL HYSTERECTOMY    . APPENDECTOMY    . COLONOSCOPY WITH PROPOFOL N/A 02/24/2019   Procedure: COLONOSCOPY WITH PROPOFOL;  Surgeon: Christena DeemSkulskie, Martin U, MD;  Location[facility];  Service: Endoscopy;  Laterality: N/A;  . KIDNEY SURGERY       Family Psychiatric History: see below  Family History:  Family History  Problem Relation Age of Onset  . Depression Father   . Alcohol abuse Maternal Grandmother   . Alcohol abuse Paternal Grandmother   . Depression Paternal Grandmother     Social History:  Social History   Socioeconomic History  . Marital status: Married    Spouse name: Chrissie Noawilliam  . Number of children: 1  . Years of education: Not on file  . Highest education level: High school graduate  Occupational History  . Not on file  Tobacco Use  . Smoking status: Former Games developermoker  . Smokeless tobacco: Never Used  Substance and Sexual Activity  . Alcohol use: No  . Drug use: No  . Sexual activity: Yes    Partners: Male    Birth control/protection: None  Other Topics Concern  . Not on file  Social History Narrative  . Not on file   Social Determinants of Health   Financial Resource Strain: Low Risk   . Difficulty of Paying Living Expenses: Not hard at all  Food Insecurity: No Food Insecurity  . Worried About Programme researcher, broadcasting/film/videounning Out of Food in the Last Year: Never true  . Ran Out of Food in the Last Year: Never true  Transportation Needs: No Transportation Needs  . Lack of Transportation (Medical): No  . Lack of  Transportation (Non-Medical): No  Physical Activity: Inactive  . Days of Exercise per Week: 0 days  . Minutes of Exercise per Session: 0 min  Stress:   . Feeling of Stress : Not on file  Social Connections: Unknown  . Frequency of Communication with Friends and Family: Not on file  . Frequency of Social Gatherings with Friends and Family: Not on file  . Attends Religious Services: 1 to 4 times per year  . Active Member of Clubs or Organizations: Yes  . Attends Archivist Meetings: 1 to 4 times per year  . Marital Status: Married    Allergies:  Allergies  Allergen Reactions  . Ciprofloxacin Nausea And Vomiting  . Hydrocodone-Acetaminophen Hives, Nausea And Vomiting and Rash  . Sulfa  Antibiotics Rash  . Zolpidem Other (See Comments)    Getting up in the middle of night sleep walking , trying to use appliances , knives etc.  . Zolpidem Tartrate Other (See Comments)    Getting up in the middle of night sleep walking , trying to use appliances , knives etc.  . Compazine  [Prochlorperazine]   . Prochlorperazine Maleate     Muscle spasm  . Sulfamethoxazole   . Sulfur   . Vancomycin Other (See Comments)    Nausea, fatigue, mouth sores.    Metabolic Disorder Labs: No results found for: HGBA1C, MPG No results found for: PROLACTIN No results found for: CHOL, TRIG, HDL, CHOLHDL, VLDL, LDLCALC No results found for: TSH  Therapeutic Level Labs: No results found for: LITHIUM No results found for: VALPROATE No components found for:  CBMZ  Current Medications: Current Outpatient Medications  Medication Sig Dispense Refill  . acyclovir ointment (ZOVIRAX) 5 % APPLY 1 GRAM TOPICALLY EVERY 3 HOURS AS NEEDED    . benzonatate (TESSALON) 100 MG capsule Take 1 capsule (100 mg total) by mouth 3 (three) times daily as needed. 21 capsule 0  . busPIRone (BUSPAR) 15 MG tablet Take 1 tablet (15 mg total) by mouth 2 (two) times daily. 180 tablet 0  . clonazePAM (KLONOPIN) 0.5 MG tablet Take 1 tablet (0.5 mg total) by mouth 2 (two) times daily as needed for anxiety. 60 tablet 1  . diphenoxylate-atropine (LOMOTIL) 2.5-0.025 MG per tablet TAKE 1 TABLET BY MOUTH 4 TIMES A DAY AS NEEDED FOR DIARRHEA  5  . estrogens, conjugated, (PREMARIN) 1.25 MG tablet Take by mouth.    . fluconazole (DIFLUCAN) 150 MG tablet TAKE 1 TABLET ONCE FOR YEAST INFECTION MAY REPEAT ONCE IN 3 DAYS IF NEEDED  3  . levalbuterol (XOPENEX HFA) 45 MCG/ACT inhaler Inhale 2 puffs into the lungs every 4 (four) hours as needed for wheezing.    Marland Kitchen levothyroxine (SYNTHROID) 50 MCG tablet Take by mouth.    . Multiple Vitamins-Minerals (MULTI ADULT GUMMIES) CHEW Chew by mouth.    . nitrofurantoin (MACRODANTIN) 100 MG capsule      . nitrofurantoin, macrocrystal-monohydrate, (MACROBID) 100 MG capsule TAKE 1 CAPSULE (100 MG TOTAL) BY MOUTH 2 (TWO) TIMES DAILY.  0  . phenazopyridine (PYRIDIUM) 100 MG tablet Take 100 mg by mouth.    . potassium citrate (UROCIT-K) 10 MEQ (1080 MG) SR tablet Take by mouth.    . traZODone (DESYREL) 100 MG tablet Take 1 tablet (100 mg total) by mouth at bedtime. 90 tablet 0  . valACYclovir (VALTREX) 500 MG tablet     . venlafaxine (EFFEXOR) 75 MG tablet Take 3 tablets (225 mg total) by mouth at bedtime. 270 tablet 0  .  venlafaxine XR (EFFEXOR XR) 75 MG 24 hr capsule Take 3 capsules in the morning 270 capsule 1   No current facility-administered medications for this visit.     Musculoskeletal: Strength & Muscle Tone: unable to assess due to telemed visit Gait & Station: unable to assess due to telemed visit Patient leans: unable to assess due to telemed visit  Psychiatric Specialty Exam: Review of Systems  There were no vitals taken for this visit.There is no height or weight on file to calculate BMI.  General Appearance: Fairly Groomed  Eye Contact:  Good  Speech:  Clear and Coherent and Normal Rate  Volume:  Normal  Mood:  Anxious  Affect:  Congruent  Thought Process:  Goal Directed, Linear and Descriptions of Associations: Intact  Orientation:  Full (Time, Place, and Person)  Thought Content: Logical   Suicidal Thoughts:  No  Homicidal Thoughts:  No  Memory:  Recent;   Good Remote;   Good  Judgement:  Fair  Insight:  Fair  Psychomotor Activity:  Normal  Concentration:  Concentration: Good and Attention Span: Good  Recall:  Good  Fund of Knowledge: Good  Language: Good  Akathisia:  Negative  Handed:  Right  AIMS (if indicated): not done  Assets:  Communication Skills Desire for Improvement Financial Resources/Insurance Housing  ADL's:  Intact  Cognition: WNL  Sleep:  Poor    Assessment and Plan: Patient is still under stress due to her husband's ongoing medical  issues. She still reporting poor sleep and is agreeable to increase the dose of trazodone to 150 mg for optimal effect.  1. MDD (major depressive disorder), recurrent, in partial remission (HCC) -Continue Effexor 225 mg daily. -Increase trazodone 150 mg at bedtime  2. Anxiety -Continue BuSpar 15 mg twice daily - clonazePAM (KLONOPIN) 0.5 MG tablet; Take 1 tablet (0.5 mg total) by mouth 2 (two) times daily as needed for anxiety.  Dispense: 60 tablet; Refill: 1  F/up in 6 weeks.   Zena Amos, MD 06/25/2019, 9:56 AM

## 2019-08-05 ENCOUNTER — Other Ambulatory Visit
Admission: RE | Admit: 2019-08-05 | Discharge: 2019-08-05 | Disposition: A | Discharge status: Home / Self Care | Payer: 59 | Attending: Urology | Admitting: Urology

## 2019-08-05 ENCOUNTER — Encounter: Payer: Self-pay | Admitting: Urology

## 2019-08-05 ENCOUNTER — Ambulatory Visit: Payer: 59 | Admitting: Urology

## 2019-08-05 ENCOUNTER — Other Ambulatory Visit: Payer: Self-pay

## 2019-08-05 VITALS — BP 120/65 | HR 98 | Ht 59.0 in | Wt 113.0 lb

## 2019-08-05 DIAGNOSIS — N200 Calculus of kidney: Secondary | ICD-10-CM | POA: Diagnosis present

## 2019-08-05 DIAGNOSIS — E7209 Other disorders of amino-acid transport: Secondary | ICD-10-CM

## 2019-08-05 LAB — URINALYSIS, COMPLETE (UACMP) WITH MICROSCOPIC
Bilirubin Urine: NEGATIVE
Glucose, UA: NEGATIVE mg/dL
Hgb urine dipstick: NEGATIVE
Nitrite: POSITIVE — AB
Protein, ur: NEGATIVE mg/dL
Specific Gravity, Urine: 1.02 (ref 1.005–1.030)
pH: 8.5 — ABNORMAL HIGH (ref 5.0–8.0)

## 2019-08-05 MED ORDER — POTASSIUM CITRATE ER 10 MEQ (1080 MG) PO TBCR: 20.0000 meq | EXTENDED_RELEASE_TABLET | Freq: Two times a day (BID) | ORAL | 11 refills | Status: DC

## 2019-08-05 NOTE — Patient Instructions (Signed)
In patients with cystine kidney stones, the most important prevention strategies are potassium citrate to alkalinize the urine, drinking plenty of fluids with goal of 2.5 liters of urine output per day, low salt diet, and minimizing protein in the diet.  Dietary Guidelines to Help Prevent Kidney Stones Kidney stones are deposits of minerals and salts that form inside your kidneys. Your risk of developing kidney stones may be greater depending on your diet, your lifestyle, the medicines you take, and whether you have certain medical conditions. Most people can reduce their chances of developing kidney stones by following the instructions below. Depending on your overall health and the type of kidney stones you tend to develop, your dietitian may give you more specific instructions. What are tips for following this plan? Reading food labels  Choose foods with "no salt added" or "low-salt" labels. Limit your sodium intake to less than 1500 mg per day.  Choose foods with calcium for each meal and snack. Try to eat about 300 mg of calcium at each meal. Foods that contain 200-500 mg of calcium per serving include: ? 8 oz (237 ml) of milk, fortified nondairy milk, and fortified fruit juice. ? 8 oz (237 ml) of kefir, yogurt, and soy yogurt. ? 4 oz (118 ml) of tofu. ? 1 oz of cheese. ? 1 cup (300 g) of dried figs. ? 1 cup (91 g) of cooked broccoli. ? 1-3 oz can of sardines or mackerel.  Most people need 1000 to 1500 mg of calcium each day. Talk to your dietitian about how much calcium is recommended for you. Shopping  Buy plenty of fresh fruits and vegetables. Most people do not need to avoid fruits and vegetables, even if they contain nutrients that may contribute to kidney stones.  When shopping for convenience foods, choose: ? Whole pieces of fruit. ? Premade salads with dressing on the side. ? Low-fat fruit and yogurt smoothies.  Avoid buying frozen meals or prepared deli foods.  Look for  foods with live cultures, such as yogurt and kefir. Cooking  Do not add salt to food when cooking. Place a salt shaker on the table and allow each person to add his or her own salt to taste.  Use vegetable protein, such as beans, textured vegetable protein (TVP), or tofu instead of meat in pasta, casseroles, and soups. Meal planning   Eat less salt, if told by your dietitian. To do this: ? Avoid eating processed or premade food. ? Avoid eating fast food.  Eat less animal protein, including cheese, meat, poultry, or fish, if told by your dietitian. To do this: ? Limit the number of times you have meat, poultry, fish, or cheese each week. Eat a diet free of meat at least 2 days a week. ? Eat only one serving each day of meat, poultry, fish, or seafood. ? When you prepare animal protein, cut pieces into small portion sizes. For most meat and fish, one serving is about the size of one deck of cards.  Eat at least 5 servings of fresh fruits and vegetables each day. To do this: ? Keep fruits and vegetables on hand for snacks. ? Eat 1 piece of fruit or a handful of berries with breakfast. ? Have a salad and fruit at lunch. ? Have two kinds of vegetables at dinner.  Limit foods that are high in a substance called oxalate. These include: ? Spinach. ? Rhubarb. ? Beets. ? Potato chips and french fries. ? Nuts.  If you  regularly take a diuretic medicine, make sure to eat at least 1-2 fruits or vegetables high in potassium each day. These include: ? Avocado. ? Banana. ? Orange, prune, carrot, or tomato juice. ? Baked potato. ? Cabbage. ? Beans and split peas. General instructions   Drink enough fluid to keep your urine clear or pale yellow. This is the most important thing you can do.  Talk to your health care provider and dietitian about taking daily supplements. Depending on your health and the cause of your kidney stones, you may be advised: ? Not to take supplements with vitamin  C. ? To take a calcium supplement. ? To take a daily probiotic supplement. ? To take other supplements such as magnesium, fish oil, or vitamin B6.  Take all medicines and supplements as told by your health care provider.  Limit alcohol intake to no more than 1 drink a day for nonpregnant women and 2 drinks a day for men. One drink equals 12 oz of beer, 5 oz of wine, or 1 oz of hard liquor.  Lose weight if told by your health care provider. Work with your dietitian to find strategies and an eating plan that works best for you. What foods are not recommended? Limit your intake of the following foods, or as told by your dietitian. Talk to your dietitian about specific foods you should avoid based on the type of kidney stones and your overall health. Grains Breads. Bagels. Rolls. Baked goods. Salted crackers. Cereal. Pasta. Vegetables Spinach. Rhubarb. Beets. Canned vegetables. Rosita Fire. Olives. Meats and other protein foods Nuts. Nut butters. Large portions of meat, poultry, or fish. Salted or cured meats. Deli meats. Hot dogs. Sausages. Dairy Cheese. Beverages Regular soft drinks. Regular vegetable juice. Seasonings and other foods Seasoning blends with salt. Salad dressings. Canned soups. Soy sauce. Ketchup. Barbecue sauce. Canned pasta sauce. Casseroles. Pizza. Lasagna. Frozen meals. Potato chips. [place] fries. Summary  You can reduce your risk of kidney stones by making changes to your diet.  The most important thing you can do is drink enough fluid. You should drink enough fluid to keep your urine clear or pale yellow.  Ask your health care provider or dietitian how much protein from animal sources you should eat each day, and also how much salt and calcium you should have each day. This information is not intended to replace advice given to you by your health care provider. Make sure you discuss any questions you have with your health care provider. Document Revised: 10/02/2018  Document Reviewed: 05/23/2016 Elsevier Patient Education  2020 [employer].

## 2019-08-05 NOTE — Progress Notes (Signed)
08/05/19 1:32 PM   Carol Hensley [birth year] [id number]  Referring provider: Lauro Regulus, MD [address] [facility] [place],  [place] [address]  CC: Cystine nephrolithiasis  HPI: I saw Carol Hensley in urology clinic today for evaluation of nephrolithiasis.  She is a 59 year old female with history of anxiety and depression as well as extensive history of cystine stones who presents to establish care for nephrolithiasis.  Her prior urologist was at [facility], but these records are unavailable to me.  She reports she has had stones since she was a teenager and has had over 10 surgeries for kidney stones.  She has been managed very well on 20 mEq potassium citrate twice daily, and has not had any stone episodes over the last 5 years.  The most recent imaging that I can review is a KUB from July 2020 that shows only a small 3 mm right midpole stone.  She denies any gross hematuria, flank pain, dysuria, or other urinary symptoms today.  She reportedly has had multiple 24-hour urine collections previously, but these are unavailable for me to review.  Urine pH today is excellent at 8.5.  PMH: Past Medical History:  Diagnosis Date  . Agoraphobia with panic disorder   . Generalized anxiety disorder   . Grief   . History of posttraumatic stress disorder (PTSD)   . Insomnia   . Major depressive disorder, recurrent episode, moderate (HCC)   . Persistent insomnia   . Posttraumatic stress disorder     Surgical History: Past Surgical History:  Procedure Laterality Date  . ABDOMINAL HYSTERECTOMY    . APPENDECTOMY    . COLONOSCOPY WITH PROPOFOL N/A 02/24/2019   Procedure: COLONOSCOPY WITH PROPOFOL;  Surgeon: Christena Deem, MD;  Location: [facility] ENDOSCOPY;  Service: Endoscopy;  Laterality: N/A;  . KIDNEY SURGERY      Allergies:  Allergies  Allergen Reactions  . Ciprofloxacin Nausea And Vomiting  . Hydrocodone-Acetaminophen Hives, Nausea And  Vomiting and Rash  . Sulfa Antibiotics Rash  . Zolpidem Other (See Comments)    Getting up in the middle of night sleep walking , trying to use appliances , knives etc.  . Zolpidem Tartrate Other (See Comments)    Getting up in the middle of night sleep walking , trying to use appliances , knives etc.  . Compazine  [Prochlorperazine]   . Prochlorperazine Maleate     Muscle spasm  . Sulfamethoxazole   . Sulfur   . Vancomycin Other (See Comments)    Nausea, fatigue, mouth sores.    Family History: Family History  Problem Relation Age of Onset  . Depression Father   . Alcohol abuse Maternal Grandmother   . Alcohol abuse Paternal Grandmother   . Depression Paternal Grandmother     Social History:  reports that she has quit smoking. She has never used smokeless tobacco. She reports that she does not drink alcohol or use drugs.  ROS: Please see flowsheet from today's date for complete review of systems.  Physical Exam: BP 120/65 (BP Location: Left Arm, Patient Position: Sitting, Cuff Size: Normal)   Pulse 98   Ht 4\' 11"  (1.499 m)   Wt 113 lb (51.3 kg)   BMI 22.82 kg/m    Constitutional:  Alert and oriented, No acute distress. Cardiovascular: No clubbing, cyanosis, or edema. Respiratory: Normal respiratory effort, no increased work of breathing. GI: Abdomen is soft, nontender, nondistended, no abdominal masses GU: No CVA tenderness Lymph: No cervical  or inguinal lymphadenopathy. Skin: No rashes, bruises or suspicious lesions. Neurologic: Grossly intact, no focal deficits, moving all 4 extremities. Psychiatric: Normal mood and affect.  Laboratory Data: Reviewed, urine pH 8.5 today  Pertinent Imaging: See HPI  Assessment & Plan:   In summary, the patient is a 59 year old female with extensive history of cystine nephrolithiasis.  She denies any stone episodes over the last 5 years and is currently doing very well on potassium citrate 20 mEq twice daily for urinary  alkalinization.  Urine pH is excellent at 8.5 today.  She has asymptomatic bacteriuria today that does not require treatment as she denies any urinary symptoms.  We had a long conversation about treatment strategies for patients with cystine stones including alkalinization therapy with potassium citrate and goal pH >7, high fluid intake with goal urine output of 2.5 L/day, and minimizing proteins and salt in the diet.  RTC 1 year with KUB prior Continue potassium citrate 20 mEq twice daily  A total of 30 minutes were spent face-to-face with the patient, greater than 50% was spent in patient education, counseling, and coordination of care regarding cystine nephrolithiasis and prevention.   Billey Co, [facility] [address], [place], [place] [address] [phone]

## 2019-08-12 ENCOUNTER — Telehealth (HOSPITAL_COMMUNITY): Payer: Self-pay | Admitting: *Deleted

## 2019-08-12 ENCOUNTER — Ambulatory Visit (INDEPENDENT_AMBULATORY_CARE_PROVIDER_SITE_OTHER): Payer: 59 | Admitting: Psychiatry

## 2019-08-12 ENCOUNTER — Encounter: Payer: Self-pay | Admitting: Psychiatry

## 2019-08-12 ENCOUNTER — Other Ambulatory Visit: Payer: Self-pay

## 2019-08-12 DIAGNOSIS — F419 Anxiety disorder, unspecified: Secondary | ICD-10-CM

## 2019-08-12 DIAGNOSIS — F3341 Major depressive disorder, recurrent, in partial remission: Secondary | ICD-10-CM | POA: Diagnosis not present

## 2019-08-12 DIAGNOSIS — F331 Major depressive disorder, recurrent, moderate: Secondary | ICD-10-CM

## 2019-08-12 MED ORDER — TRAZODONE HCL 150 MG PO TABS: 150.0000 mg | ORAL_TABLET | Freq: Every day | ORAL | 0 refills | Status: DC

## 2019-08-12 MED ORDER — VENLAFAXINE HCL ER 75 MG PO CP24: ORAL_CAPSULE | ORAL | 0 refills | Status: DC

## 2019-08-12 MED ORDER — BUSPIRONE HCL 15 MG PO TABS: 15.0000 mg | ORAL_TABLET | Freq: Two times a day (BID) | ORAL | 0 refills | Status: DC

## 2019-08-12 MED ORDER — CLONAZEPAM 0.5 MG PO TABS: 0.5000 mg | ORAL_TABLET | Freq: Two times a day (BID) | ORAL | 1 refills | Status: DC | PRN

## 2019-08-12 NOTE — Telephone Encounter (Signed)
Pt therapist Felecia Jan left message expressing that she is having "concerns" regarding pt, however did not specify. She stated that you have spoken before regarding pt and would to touch base again. Her phone # is [phone].

## 2019-08-12 NOTE — Progress Notes (Signed)
[facility] MD OP Progress Note  I connected with  Carol Hensley on 08/12/19 by a video enabled telemedicine application and verified that I am speaking with the correct person using two identifiers.   I discussed the limitations of evaluation and management by telemedicine. The patient expressed understanding and agreed to proceed.    08/12/2019 9:00 AM [name]  MRN:  [id number]  Chief Complaint: " I am doing okay."  HPI: Patient reported that she is doing okay for now.  She had to see the other specialist for diarrhea and also recurring renal stones in the past few weeks.  She reported that she is feeling better now. She informed that her husband is making fair progress and is doing better than before.  She was able to sleep better after the dose of trazodone was increased to 150 mg last time.  Overall she feels her medications are working okay for her. She understands that most of her anxiety and stress is related to her husband's health issues and she knows that once he starts doing better she will feel better too. She denies any other concerns at this time.   Visit Diagnosis:    ICD-10-CM   1. MDD (major depressive disorder), recurrent, in partial remission ([place])  F33.41   2. Anxiety  F41.9     Past Psychiatric History: MDD, anxiety  Past Medical History:  Past Medical History:  Diagnosis Date  . Agoraphobia with panic disorder   . Generalized anxiety disorder   . Grief   . History of posttraumatic stress disorder (PTSD)   . Insomnia   . Major depressive disorder, recurrent episode, moderate ([place])   . Persistent insomnia   . Posttraumatic stress disorder     Past Surgical History:  Procedure Laterality Date  . ABDOMINAL HYSTERECTOMY    . APPENDECTOMY    . COLONOSCOPY WITH PROPOFOL N/A 02/24/2019   Procedure: COLONOSCOPY WITH PROPOFOL;  Surgeon: Lollie Sails, MD;  Location: [facility] ENDOSCOPY;  Service: Endoscopy;  Laterality: N/A;  . KIDNEY SURGERY       Family Psychiatric History: see below  Family History:  Family History  Problem Relation Age of Onset  . Depression Father   . Alcohol abuse Maternal Grandmother   . Alcohol abuse Paternal Grandmother   . Depression Paternal Grandmother     Social History:  Social History   Socioeconomic History  . Marital status: Married    Spouse name: Carol Hensley  . Number of children: 1  . Years of education: Not on file  . Highest education level: High school graduate  Occupational History  . Not on file  Tobacco Use  . Smoking status: Former Research scientist (life sciences)  . Smokeless tobacco: Never Used  Substance and Sexual Activity  . Alcohol use: No  . Drug use: No  . Sexual activity: Yes    Partners: Male    Birth control/protection: None  Other Topics Concern  . Not on file  Social History Narrative  . Not on file   Social Determinants of Health   Financial Resource Strain: Low Risk   . Difficulty of Paying Living Expenses: Not hard at all  Food Insecurity: No Food Insecurity  . Worried About Charity fundraiser in the Last Year: Never true  . Ran Out of Food in the Last Year: Never true  Transportation Needs: No Transportation Needs  . Lack of Transportation (Medical): No  . Lack of Transportation (Non-Medical): No  Physical Activity: Inactive  . Days  of Exercise per Week: 0 days  . Minutes of Exercise per Session: 0 min  Stress:   . Feeling of Stress : Not on file  Social Connections: Unknown  . Frequency of Communication with Friends and Family: Not on file  . Frequency of Social Gatherings with Friends and Family: Not on file  . Attends Religious Services: 1 to 4 times per year  . Active Member of Clubs or Organizations: Yes  . Attends Banker Meetings: 1 to 4 times per year  . Marital Status: Married    Allergies:  Allergies  Allergen Reactions  . Ciprofloxacin Nausea And Vomiting  . Hydrocodone-Acetaminophen Hives, Nausea And Vomiting and Rash  . Sulfa  Antibiotics Rash  . Zolpidem Other (See Comments)    Getting up in the middle of night sleep walking , trying to use appliances , knives etc.  . Zolpidem Tartrate Other (See Comments)    Getting up in the middle of night sleep walking , trying to use appliances , knives etc.  . Compazine  [Prochlorperazine]   . Prochlorperazine Maleate     Muscle spasm  . Sulfamethoxazole   . Sulfur   . Vancomycin Other (See Comments)    Nausea, fatigue, mouth sores.    Metabolic Disorder Labs: No results found for: HGBA1C, MPG No results found for: PROLACTIN No results found for: CHOL, TRIG, HDL, CHOLHDL, VLDL, LDLCALC No results found for: TSH  Therapeutic Level Labs: No results found for: LITHIUM No results found for: VALPROATE No components found for:  CBMZ  Current Medications: Current Outpatient Medications  Medication Sig Dispense Refill  . acyclovir ointment (ZOVIRAX) 5 % APPLY 1 GRAM TOPICALLY EVERY 3 HOURS AS NEEDED    . busPIRone (BUSPAR) 15 MG tablet Take 1 tablet (15 mg total) by mouth 2 (two) times daily. 180 tablet 0  . cholestyramine (QUESTRAN) 4 g packet Take by mouth.    . clonazePAM (KLONOPIN) 0.5 MG tablet Take 1 tablet (0.5 mg total) by mouth 2 (two) times daily as needed for anxiety. 60 tablet 1  . diphenoxylate-atropine (LOMOTIL) 2.5-0.025 MG per tablet TAKE 1 TABLET BY MOUTH 4 TIMES A DAY AS NEEDED FOR DIARRHEA  5  . estrogens, conjugated, (PREMARIN) 1.25 MG tablet Take by mouth.    . itraconazole (SPORANOX) 100 MG capsule Take by mouth.    . levalbuterol (XOPENEX HFA) 45 MCG/ACT inhaler Inhale 2 puffs into the lungs every 4 (four) hours as needed for wheezing.    Marland Kitchen levothyroxine (SYNTHROID) 50 MCG tablet Take by mouth.    . Multiple Vitamins-Minerals (MULTI ADULT GUMMIES) CHEW Chew by mouth.    . phenazopyridine (PYRIDIUM) 100 MG tablet Take 100 mg by mouth.    . potassium citrate (UROCIT-K) 10 MEQ (1080 MG) SR tablet Take 2 tablets (20 mEq total) by mouth 2 (two) times  daily. 120 tablet 11  . traZODone (DESYREL) 150 MG tablet Take 1 tablet (150 mg total) by mouth at bedtime. 90 tablet 0  . venlafaxine XR (EFFEXOR XR) 75 MG 24 hr capsule Take 3 capsules in the morning 270 capsule 1   No current facility-administered medications for this visit.     Musculoskeletal: Strength & Muscle Tone: unable to assess due to telemed visit Gait & Station: unable to assess due to telemed visit Patient leans: unable to assess due to telemed visit  Psychiatric Specialty Exam: Review of Systems  There were no vitals taken for this visit.There is no height or weight on file  to calculate BMI.  General Appearance: Fairly Groomed  Eye Contact:  Good  Speech:  Clear and Coherent and Normal Rate  Volume:  Normal  Mood: less Anxious, less depressed  Affect:  Congruent  Thought Process:  Goal Directed, Linear and Descriptions of Associations: Intact  Orientation:  Full (Time, Place, and Person)  Thought Content: Logical   Suicidal Thoughts:  No  Homicidal Thoughts:  No  Memory:  Recent;   Good Remote;   Good  Judgement:  Fair  Insight:  Fair  Psychomotor Activity:  Normal  Concentration:  Concentration: Good and Attention Span: Good  Recall:  Good  Fund of Knowledge: Good  Language: Good  Akathisia:  Negative  Handed:  Right  AIMS (if indicated): not done  Assets:  Communication Skills Desire for Improvement Financial Resources/Insurance Housing  ADL's:  Intact  Cognition: WNL  Sleep:  Good with help of higher dose of trazodone    Assessment and Plan: Patient reported improvement in her symptoms, her husband is doing better than before. She would like to continue the same regimen for now.  1. MDD (major depressive disorder), recurrent, in partial remission (HCC)  - traZODone (DESYREL) 150 MG tablet; Take 1 tablet (150 mg total) by mouth at bedtime.  Dispense: 90 tablet; Refill: 0 - venlafaxine XR (EFFEXOR XR) 75 MG 24 hr capsule; Take 3 capsules in the  morning  Dispense: 270 capsule; Refill: 0  2. Anxiety  - clonazePAM (KLONOPIN) 0.5 MG tablet; Take 1 tablet (0.5 mg total) by mouth 2 (two) times daily as needed for anxiety.  Dispense: 60 tablet; Refill: 1 - busPIRone (BUSPAR) 15 MG tablet; Take 1 tablet (15 mg total) by mouth 2 (two) times daily.  Dispense: 180 tablet; Refill: 0 - venlafaxine XR (EFFEXOR XR) 75 MG 24 hr capsule; Take 3 capsules in the morning  Dispense: 270 capsule; Refill: 0  Continue same medication regimen. Follow up in 2 months.   Zena Amos, MD 08/12/2019, 9:00 AM

## 2019-08-13 NOTE — Telephone Encounter (Signed)
I spoke with Ms. Carol Hensley regarding the patient. Discussed patient's progress and updated Ms. Carol Hensley regarding her medications.

## 2019-09-08 ENCOUNTER — Other Ambulatory Visit
Admission: RE | Admit: 2019-09-08 | Discharge: 2019-09-08 | Disposition: A | Discharge status: Home / Self Care | Payer: 59 | Source: Ambulatory Visit | Attending: Gastroenterology | Admitting: Gastroenterology

## 2019-09-08 DIAGNOSIS — Z8619 Personal history of other infectious and parasitic diseases: Secondary | ICD-10-CM | POA: Insufficient documentation

## 2019-09-08 DIAGNOSIS — R197 Diarrhea, unspecified: Secondary | ICD-10-CM | POA: Diagnosis not present

## 2019-09-08 LAB — CLOSTRIDIUM DIFFICILE BY PCR, REFLEXED: Toxigenic C. Difficile by PCR: POSITIVE — AB

## 2019-09-08 LAB — C DIFFICILE QUICK SCREEN W PCR REFLEX
C Diff antigen: POSITIVE — AB
C Diff toxin: NEGATIVE

## 2019-09-11 LAB — O&P RESULT

## 2019-09-11 LAB — GIARDIA, EIA; OVA/PARASITE: Giardia Ag, Stl: NEGATIVE

## 2019-09-12 LAB — STOOL CULTURE: E coli, Shiga toxin Assay: NEGATIVE

## 2019-09-12 LAB — STOOL CULTURE REFLEX - CMPCXR

## 2019-09-12 LAB — STOOL CULTURE REFLEX - RSASHR

## 2019-10-02 ENCOUNTER — Other Ambulatory Visit: Payer: Self-pay

## 2019-10-02 ENCOUNTER — Encounter: Payer: Self-pay | Admitting: Psychiatry

## 2019-10-02 ENCOUNTER — Ambulatory Visit (INDEPENDENT_AMBULATORY_CARE_PROVIDER_SITE_OTHER): Payer: 59 | Admitting: Psychiatry

## 2019-10-02 DIAGNOSIS — F331 Major depressive disorder, recurrent, moderate: Secondary | ICD-10-CM

## 2019-10-02 DIAGNOSIS — F419 Anxiety disorder, unspecified: Secondary | ICD-10-CM

## 2019-10-02 DIAGNOSIS — F3341 Major depressive disorder, recurrent, in partial remission: Secondary | ICD-10-CM

## 2019-10-02 MED ORDER — BREXPIPRAZOLE 1 MG PO TABS: 1.0000 mg | ORAL_TABLET | Freq: Every day | ORAL | 0 refills | Status: DC

## 2019-10-02 MED ORDER — CLONAZEPAM 0.5 MG PO TABS: 0.5000 mg | ORAL_TABLET | Freq: Two times a day (BID) | ORAL | 1 refills | Status: DC | PRN

## 2019-10-02 MED ORDER — TRAZODONE HCL 150 MG PO TABS: 150.0000 mg | ORAL_TABLET | Freq: Every day | ORAL | 0 refills | Status: DC

## 2019-10-02 MED ORDER — BUSPIRONE HCL 15 MG PO TABS: 15.0000 mg | ORAL_TABLET | Freq: Two times a day (BID) | ORAL | 0 refills | Status: DC

## 2019-10-02 MED ORDER — VENLAFAXINE HCL ER 75 MG PO CP24: ORAL_CAPSULE | ORAL | 0 refills | Status: DC

## 2019-10-02 NOTE — Progress Notes (Signed)
[facility] MD OP Progress Note  I connected with  Carol Hensley on 10/02/19 by a video enabled telemedicine application and verified that I am speaking with the correct person using two identifiers.   I discussed the limitations of evaluation and management by telemedicine. The patient expressed understanding and agreed to proceed.    10/02/2019 9:42 AM Carol Hensley  MRN:  [id number]  Chief Complaint: " I am very depressed."  HPI: Patient reported that she has been very depressed lately.  She stated that she feels like she has no energy and is always worried.  She feels easily tearful.  She cannot focus and stay on task.  She informed that her husband is still undergoing chemotherapy at [facility] and she has to stay on top of that.  She also informed that her mother is quite sick.  Her uncle is taking care of her mother as patient is unable to do so due to her busy schedule with her husband's illness.  Patient stated that she really worries about her family members.  She does not get any joy from anything.  She has been talking to her therapist Ms. Otila Kluver regularly.  Patient asked if she could try Rexulti as she has seen his commercials on TV.  She stated that she would like to give it a try to see if that would help her feel better.  Visit Diagnosis:    ICD-10-CM   1. MDD (major depressive disorder), recurrent episode, moderate (HCC)  F33.1   2. Anxiety  F41.9     Past Psychiatric History: MDD, anxiety  Past Medical History:  Past Medical History:  Diagnosis Date  . Agoraphobia with panic disorder   . Generalized anxiety disorder   . Grief   . History of posttraumatic stress disorder (PTSD)   . Insomnia   . Major depressive disorder, recurrent episode, moderate ([place])   . Persistent insomnia   . Posttraumatic stress disorder     Past Surgical History:  Procedure Laterality Date  . ABDOMINAL HYSTERECTOMY    . APPENDECTOMY    . COLONOSCOPY WITH PROPOFOL N/A 02/24/2019   Procedure:  COLONOSCOPY WITH PROPOFOL;  Surgeon: Lollie Sails, MD;  Location: [facility] ENDOSCOPY;  Service: Endoscopy;  Laterality: N/A;  . KIDNEY SURGERY      Family Psychiatric History: see below  Family History:  Family History  Problem Relation Age of Onset  . Depression Father   . Alcohol abuse Maternal Grandmother   . Alcohol abuse Paternal Grandmother   . Depression Paternal Grandmother     Social History:  Social History   Socioeconomic History  . Marital status: Married    Spouse name: Gwyndolyn Saxon  . Number of children: 1  . Years of education: Not on file  . Highest education level: High school graduate  Occupational History  . Not on file  Tobacco Use  . Smoking status: Former Research scientist (life sciences)  . Smokeless tobacco: Never Used  Substance and Sexual Activity  . Alcohol use: No  . Drug use: No  . Sexual activity: Yes    Partners: Male    Birth control/protection: None  Other Topics Concern  . Not on file  Social History Narrative  . Not on file   Social Determinants of Health   Financial Resource Strain: Low Risk   . Difficulty of Paying Living Expenses: Not hard at all  Food Insecurity: No Food Insecurity  . Worried About Charity fundraiser in the Last Year: Never true  .  Ran Out of Food in the Last Year: Never true  Transportation Needs: No Transportation Needs  . Lack of Transportation (Medical): No  . Lack of Transportation (Non-Medical): No  Physical Activity: Inactive  . Days of Exercise per Week: 0 days  . Minutes of Exercise per Session: 0 min  Stress:   . Feeling of Stress :   Social Connections: Unknown  . Frequency of Communication with Friends and Family: Not on file  . Frequency of Social Gatherings with Friends and Family: Not on file  . Attends Religious Services: 1 to 4 times per year  . Active Member of Clubs or Organizations: Yes  . Attends Banker Meetings: 1 to 4 times per year  . Marital Status: Married    Allergies:  Allergies   Allergen Reactions  . Ciprofloxacin Nausea And Vomiting  . Hydrocodone-Acetaminophen Hives, Nausea And Vomiting and Rash  . Sulfa Antibiotics Rash  . Zolpidem Other (See Comments)    Getting up in the middle of night sleep walking , trying to use appliances , knives etc.  . Zolpidem Tartrate Other (See Comments)    Getting up in the middle of night sleep walking , trying to use appliances , knives etc.  . Compazine  [Prochlorperazine]   . Prochlorperazine Maleate     Muscle spasm  . Sulfamethoxazole   . Sulfur   . Vancomycin Other (See Comments)    Nausea, fatigue, mouth sores.    Metabolic Disorder Labs: No results found for: HGBA1C, MPG No results found for: PROLACTIN No results found for: CHOL, TRIG, HDL, CHOLHDL, VLDL, LDLCALC No results found for: TSH  Therapeutic Level Labs: No results found for: LITHIUM No results found for: VALPROATE No components found for:  CBMZ  Current Medications: Current Outpatient Medications  Medication Sig Dispense Refill  . acyclovir ointment (ZOVIRAX) 5 % APPLY 1 GRAM TOPICALLY EVERY 3 HOURS AS NEEDED    . busPIRone (BUSPAR) 15 MG tablet Take 1 tablet (15 mg total) by mouth 2 (two) times daily. 180 tablet 0  . cholestyramine (QUESTRAN) 4 g packet Take by mouth.    . clonazePAM (KLONOPIN) 0.5 MG tablet Take 1 tablet (0.5 mg total) by mouth 2 (two) times daily as needed for anxiety. 60 tablet 1  . diphenoxylate-atropine (LOMOTIL) 2.5-0.025 MG per tablet TAKE 1 TABLET BY MOUTH 4 TIMES A DAY AS NEEDED FOR DIARRHEA  5  . estrogens, conjugated, (PREMARIN) 1.25 MG tablet Take by mouth.    . itraconazole (SPORANOX) 100 MG capsule Take by mouth.    . levalbuterol (XOPENEX HFA) 45 MCG/ACT inhaler Inhale 2 puffs into the lungs every 4 (four) hours as needed for wheezing.    Marland Kitchen levothyroxine (SYNTHROID) 50 MCG tablet Take by mouth.    . Multiple Vitamins-Minerals (MULTI ADULT GUMMIES) CHEW Chew by mouth.    . phenazopyridine (PYRIDIUM) 100 MG tablet  Take 100 mg by mouth.    . potassium citrate (UROCIT-K) 10 MEQ (1080 MG) SR tablet Take 2 tablets (20 mEq total) by mouth 2 (two) times daily. 120 tablet 11  . traZODone (DESYREL) 150 MG tablet Take 1 tablet (150 mg total) by mouth at bedtime. 90 tablet 0  . venlafaxine XR (EFFEXOR XR) 75 MG 24 hr capsule Take 3 capsules in the morning 270 capsule 0   No current facility-administered medications for this visit.     Musculoskeletal: Strength & Muscle Tone: unable to assess due to telemed visit Gait & Station: unable to assess due  to telemed visit Patient leans: unable to assess due to telemed visit  Psychiatric Specialty Exam: Review of Systems  There were no vitals taken for this visit.There is no height or weight on file to calculate BMI.  General Appearance: Fairly Groomed  Eye Contact:  Good  Speech:  Clear and Coherent and Normal Rate  Volume:  Normal  Mood: Depressed  Affect:  Congruent, Sad  Thought Process:  Goal Directed, Linear and Descriptions of Associations: Intact  Orientation:  Full (Time, Place, and Person)  Thought Content: Logical   Suicidal Thoughts:  No  Homicidal Thoughts:  No  Memory:  Recent;   Good Remote;   Good  Judgement:  Fair  Insight:  Fair  Psychomotor Activity:  Normal  Concentration:  Concentration: Good and Attention Span: Good  Recall:  Good  Fund of Knowledge: Good  Language: Good  Akathisia:  Negative  Handed:  Right  AIMS (if indicated): not done  Assets:  Communication Skills Desire for Improvement Financial Resources/Insurance Housing  ADL's:  Intact  Cognition: WNL  Sleep:  Good with help of higher dose of trazodone    Assessment and Plan: Patient reported feeling depressed due to ongoing stress of dealing with her husband (cancer) and mother's illness.  She stated she would like to try Rexulti as she has seen the commercials.  1. MDD (major depressive disorder), recurrent episode, moderate (HCC)  - venlafaxine XR (EFFEXOR  XR) 75 MG 24 hr capsule; Take 3 capsules in the morning  Dispense: 270 capsule; Refill: 0 - traZODone (DESYREL) 150 MG tablet; Take 1 tablet (150 mg total) by mouth at bedtime.  Dispense: 90 tablet; Refill: 0 - Start brexpiprazole (REXULTI) 1 MG TABS tablet; Take 1 tablet (1 mg total) by mouth daily.  Dispense: 90 tablet; Refill: 0  2. Anxiety  - clonazePAM (KLONOPIN) 0.5 MG tablet; Take 1 tablet (0.5 mg total) by mouth 2 (two) times daily as needed for anxiety.  Dispense: 60 tablet; Refill: 1 - venlafaxine XR (EFFEXOR XR) 75 MG 24 hr capsule; Take 3 capsules in the morning  Dispense: 270 capsule; Refill: 0 - busPIRone (BUSPAR) 15 MG tablet; Take 1 tablet (15 mg total) by mouth 2 (two) times daily.  Dispense: 180 tablet; Refill: 0   Continue individual therapy with Ms. Inetta Fermo. Follow up in 6 weeks.   Zena Amos, MD 10/02/2019, 9:42 AM

## 2019-11-13 ENCOUNTER — Telehealth (INDEPENDENT_AMBULATORY_CARE_PROVIDER_SITE_OTHER): Payer: 59 | Admitting: Psychiatry

## 2019-11-13 ENCOUNTER — Encounter: Payer: Self-pay | Admitting: Psychiatry

## 2019-11-13 ENCOUNTER — Other Ambulatory Visit: Payer: Self-pay

## 2019-11-13 DIAGNOSIS — F419 Anxiety disorder, unspecified: Secondary | ICD-10-CM

## 2019-11-13 DIAGNOSIS — F331 Major depressive disorder, recurrent, moderate: Secondary | ICD-10-CM | POA: Diagnosis not present

## 2019-11-13 MED ORDER — CLONAZEPAM 0.5 MG PO TABS: 0.5000 mg | ORAL_TABLET | Freq: Two times a day (BID) | ORAL | 1 refills | Status: DC | PRN

## 2019-11-13 MED ORDER — TRAZODONE HCL 150 MG PO TABS: 150.0000 mg | ORAL_TABLET | Freq: Every day | ORAL | 0 refills | Status: DC

## 2019-11-13 MED ORDER — VENLAFAXINE HCL ER 75 MG PO CP24: ORAL_CAPSULE | ORAL | 0 refills | Status: DC

## 2019-11-13 MED ORDER — BREXPIPRAZOLE 1 MG PO TABS: 1.0000 mg | ORAL_TABLET | Freq: Every day | ORAL | 0 refills | Status: DC

## 2019-11-13 MED ORDER — BUSPIRONE HCL 15 MG PO TABS: 15.0000 mg | ORAL_TABLET | Freq: Two times a day (BID) | ORAL | 0 refills | Status: DC

## 2019-11-13 NOTE — Progress Notes (Signed)
[facility] MD OP Progress Note  Virtual Visit via Video Note  I connected with Carol Hensley on 11/13/19 at  9:30 AM EDT by a video enabled telemedicine application and verified that I am speaking with the correct person using two identifiers.  Location: Patient: Home Provider: Clinic   I discussed the limitations of evaluation and management by telemedicine and the availability of in person appointments. The patient expressed understanding and agreed to proceed.  I provided 17 minutes of non-face-to-face time during this encounter.     11/13/2019 9:33 AM Carol Hensley  MRN:  [id number]  Chief Complaint: " I am doing better."  HPI: Patient reported that her mood has improved since starting the Rexulti. During the interview, she was smiling on occasion and appeared responsive and pleasant. She reports that her husband, who is currently undergoing therapy for his cancer, is responding well to the therapy, and the patient feels she is in better place with regards to her mood as well. The patient has also been responsible for taking care of her mother, who is also facing some health challenges. Patient reports that her mother's condition has worsened a bit but that the patient's daughter is helping to take care of her. This support has taken some of the burden off of the patient as well and contributed to the improvement in her depression/anxiety.  Visit Diagnosis:    ICD-10-CM   1. MDD (major depressive disorder), recurrent episode, moderate (HCC)  F33.1   2. Anxiety  F41.9     Past Psychiatric History: MDD, anxiety  Past Medical History:  Past Medical History:  Diagnosis Date  . Agoraphobia with panic disorder   . Generalized anxiety disorder   . Grief   . History of posttraumatic stress disorder (PTSD)   . Insomnia   . Major depressive disorder, recurrent episode, moderate (HCC)   . Persistent insomnia   . Posttraumatic stress disorder     Past Surgical History:   Procedure Laterality Date  . ABDOMINAL HYSTERECTOMY    . APPENDECTOMY    . COLONOSCOPY WITH PROPOFOL N/A 02/24/2019   Procedure: COLONOSCOPY WITH PROPOFOL;  Surgeon: Christena Deem, MD;  Location: [facility] ENDOSCOPY;  Service: Endoscopy;  Laterality: N/A;  . KIDNEY SURGERY      Family Psychiatric History: see below  Family History:  Family History  Problem Relation Age of Onset  . Depression Father   . Alcohol abuse Maternal Grandmother   . Alcohol abuse Paternal Grandmother   . Depression Paternal Grandmother     Social History:  Social History   Socioeconomic History  . Marital status: Married    Spouse name: Chrissie Noa  . Number of children: 1  . Years of education: Not on file  . Highest education level: High school graduate  Occupational History  . Not on file  Tobacco Use  . Smoking status: Former Games developer  . Smokeless tobacco: Never Used  Substance and Sexual Activity  . Alcohol use: No  . Drug use: No  . Sexual activity: Yes    Partners: Male    Birth control/protection: None  Other Topics Concern  . Not on file  Social History Narrative  . Not on file   Social Determinants of Health   Financial Resource Strain: Low Risk   . Difficulty of Paying Living Expenses: Not hard at all  Food Insecurity: No Food Insecurity  . Worried About Programme researcher, broadcasting/film/video in the Last Year: Never true  . Ran Out of  Food in the Last Year: Never true  Transportation Needs: No Transportation Needs  . Lack of Transportation (Medical): No  . Lack of Transportation (Non-Medical): No  Physical Activity: Inactive  . Days of Exercise per Week: 0 days  . Minutes of Exercise per Session: 0 min  Stress:   . Feeling of Stress :   Social Connections: Unknown  . Frequency of Communication with Friends and Family: Not on file  . Frequency of Social Gatherings with Friends and Family: Not on file  . Attends Religious Services: 1 to 4 times per year  . Active Member of Clubs or  Organizations: Yes  . Attends Archivist Meetings: 1 to 4 times per year  . Marital Status: Married    Allergies:  Allergies  Allergen Reactions  . Ciprofloxacin Nausea And Vomiting  . Hydrocodone-Acetaminophen Hives, Nausea And Vomiting and Rash  . Sulfa Antibiotics Rash  . Zolpidem Other (See Comments)    Getting up in the middle of night sleep walking , trying to use appliances , knives etc.  . Zolpidem Tartrate Other (See Comments)    Getting up in the middle of night sleep walking , trying to use appliances , knives etc.  . Compazine  [Prochlorperazine]   . Prochlorperazine Maleate     Muscle spasm  . Sulfamethoxazole   . Sulfur   . Vancomycin Other (See Comments)    Nausea, fatigue, mouth sores.    Metabolic Disorder Labs: No results found for: HGBA1C, MPG No results found for: PROLACTIN No results found for: CHOL, TRIG, HDL, CHOLHDL, VLDL, LDLCALC No results found for: TSH  Therapeutic Level Labs: No results found for: LITHIUM No results found for: VALPROATE No components found for:  CBMZ  Current Medications: Current Outpatient Medications  Medication Sig Dispense Refill  . acyclovir ointment (ZOVIRAX) 5 % APPLY 1 GRAM TOPICALLY EVERY 3 HOURS AS NEEDED    . brexpiprazole (REXULTI) 1 MG TABS tablet Take 1 tablet (1 mg total) by mouth daily. 90 tablet 0  . busPIRone (BUSPAR) 15 MG tablet Take 1 tablet (15 mg total) by mouth 2 (two) times daily. 180 tablet 0  . cholestyramine (QUESTRAN) 4 g packet Take by mouth.    . clonazePAM (KLONOPIN) 0.5 MG tablet Take 1 tablet (0.5 mg total) by mouth 2 (two) times daily as needed for anxiety. 60 tablet 1  . diphenoxylate-atropine (LOMOTIL) 2.5-0.025 MG per tablet TAKE 1 TABLET BY MOUTH 4 TIMES A DAY AS NEEDED FOR DIARRHEA  5  . estrogens, conjugated, (PREMARIN) 1.25 MG tablet Take by mouth.    . itraconazole (SPORANOX) 100 MG capsule Take by mouth.    . levalbuterol (XOPENEX HFA) 45 MCG/ACT inhaler Inhale 2 puffs  into the lungs every 4 (four) hours as needed for wheezing.    Marland Kitchen levothyroxine (SYNTHROID) 50 MCG tablet Take by mouth.    . Multiple Vitamins-Minerals (MULTI ADULT GUMMIES) CHEW Chew by mouth.    . phenazopyridine (PYRIDIUM) 100 MG tablet Take 100 mg by mouth.    . potassium citrate (UROCIT-K) 10 MEQ (1080 MG) SR tablet Take 2 tablets (20 mEq total) by mouth 2 (two) times daily. 120 tablet 11  . traZODone (DESYREL) 150 MG tablet Take 1 tablet (150 mg total) by mouth at bedtime. 90 tablet 0  . venlafaxine XR (EFFEXOR XR) 75 MG 24 hr capsule Take 3 capsules in the morning 270 capsule 0   No current facility-administered medications for this visit.     Musculoskeletal: Strength &  Muscle Tone: unable to assess due to telemed visit Gait & Station: unable to assess due to telemed visit Patient leans: unable to assess due to telemed visit  Psychiatric Specialty Exam: Review of Systems  There were no vitals taken for this visit.There is no height or weight on file to calculate BMI.  General Appearance: Fairly Groomed  Eye Contact:  Good  Speech:  Clear and Coherent and Normal Rate  Volume:  Normal  Mood: "I am better"  Affect:  Congruent, smiling on occassion  Thought Process:  Goal Directed, Linear and Descriptions of Associations: Intact  Orientation:  Full (Time, Place, and Person)  Thought Content: Logical   Suicidal Thoughts:  No  Homicidal Thoughts:  No  Memory:  Recent;   Good Remote;   Good  Judgement:  Fair  Insight:  Fair  Psychomotor Activity:  Normal  Concentration:  Concentration: Good and Attention Span: Good  Recall:  Good  Fund of Knowledge: Good  Language: Good  Akathisia:  Negative  Handed:  Right  AIMS (if indicated): not done  Assets:  Communication Skills Desire for Improvement Financial Resources/Insurance Housing  ADL's:  Intact  Cognition: WNL  Sleep:  Good with help of higher dose of trazodone    Assessment and Plan: Patient reported improvement  in her symptoms of depression since starting Rexulti. The plan is to continue Rexulti and other medications at current dosages and touch base in the next 4-6 weeks regarding continued responsiveness to therapy.  1. MDD (major depressive disorder), recurrent episode, moderate (HCC)  - venlafaxine XR (EFFEXOR XR) 75 MG 24 hr capsule; Take 3 capsules in the morning  Dispense: 270 capsule; Refill: 0 - traZODone (DESYREL) 150 MG tablet; Take 1 tablet (150 mg total) by mouth at bedtime.  Dispense: 90 tablet; Refill: 0 - Start brexpiprazole (REXULTI) 1 MG TABS tablet; Take 1 tablet (1 mg total) by mouth daily.  Dispense: 90 tablet; Refill: 0  2. Anxiety  - clonazePAM (KLONOPIN) 0.5 MG tablet; Take 1 tablet (0.5 mg total) by mouth 2 (two) times daily as needed for anxiety.  Dispense: 60 tablet; Refill: 1 - venlafaxine XR (EFFEXOR XR) 75 MG 24 hr capsule; Take 3 capsules in the morning  Dispense: 270 capsule; Refill: 0 - busPIRone (BUSPAR) 15 MG tablet; Take 1 tablet (15 mg total) by mouth 2 (two) times daily.  Dispense: 180 tablet; Refill: 0   Continue individual therapy with Ms. Inetta Fermo. Follow up in 6 weeks.   Zena Amos, MD 11/13/2019, 9:33 AM

## 2019-11-14 ENCOUNTER — Ambulatory Visit: Payer: 59 | Admitting: Psychiatry

## 2019-11-26 ENCOUNTER — Telehealth: Payer: Self-pay

## 2019-11-26 NOTE — Telephone Encounter (Signed)
90 day supply of clonazepam is not a good idea.  We will keep it as it is at her local pharmacy for now.

## 2019-11-26 NOTE — Telephone Encounter (Signed)
received fax from [facility] requesting a 90 day supply of clonazepam

## 2020-01-06 ENCOUNTER — Other Ambulatory Visit: Payer: Self-pay

## 2020-01-06 ENCOUNTER — Telehealth: Payer: 59 | Admitting: Psychiatry

## 2020-01-06 ENCOUNTER — Telehealth (INDEPENDENT_AMBULATORY_CARE_PROVIDER_SITE_OTHER): Payer: 59 | Admitting: Psychiatry

## 2020-01-06 ENCOUNTER — Encounter (HOSPITAL_COMMUNITY): Payer: Self-pay | Admitting: Psychiatry

## 2020-01-06 DIAGNOSIS — F419 Anxiety disorder, unspecified: Secondary | ICD-10-CM | POA: Diagnosis not present

## 2020-01-06 DIAGNOSIS — F3341 Major depressive disorder, recurrent, in partial remission: Secondary | ICD-10-CM

## 2020-01-06 DIAGNOSIS — F331 Major depressive disorder, recurrent, moderate: Secondary | ICD-10-CM

## 2020-01-06 MED ORDER — VENLAFAXINE HCL ER 75 MG PO CP24: ORAL_CAPSULE | ORAL | 0 refills | Status: DC

## 2020-01-06 MED ORDER — TRAZODONE HCL 150 MG PO TABS: 150.0000 mg | ORAL_TABLET | Freq: Every day | ORAL | 0 refills | Status: DC

## 2020-01-06 MED ORDER — BREXPIPRAZOLE 1 MG PO TABS: 1.0000 mg | ORAL_TABLET | Freq: Every day | ORAL | 0 refills | Status: DC

## 2020-01-06 MED ORDER — BUSPIRONE HCL 15 MG PO TABS: 15.0000 mg | ORAL_TABLET | Freq: Two times a day (BID) | ORAL | 0 refills | Status: DC

## 2020-01-06 MED ORDER — CLONAZEPAM 0.5 MG PO TABS: 0.5000 mg | ORAL_TABLET | Freq: Two times a day (BID) | ORAL | 1 refills | Status: DC | PRN

## 2020-01-06 NOTE — Progress Notes (Signed)
BH MD OP Progress Note  Virtual Visit via Video Note  I connected with Carol Hensley on 01/06/20 at  3:00 PM EDT by a video enabled telemedicine application and verified that I am speaking with the correct person using two identifiers.  Location: Patient: Home Provider: Clinic   I discussed the limitations of evaluation and management by telemedicine and the availability of in person appointments. The patient expressed understanding and agreed to proceed.  I provided 17 minutes of non-face-to-face time during this encounter.     01/06/2020 3:06 PM Carol Hensley  MRN:  [id number]  Chief Complaint: " I am doing fine."  HPI: Patient reported that she is doing fine for now.  She informed that her husband is doing well.  He is going to be starting a new cycle of chemotherapy soon.  She informed that her mother is still the same and her family members are helping out.  She is sleeping fairly well.  She stated that she wants to keep the medicine same way and request for refills.  Visit Diagnosis:    ICD-10-CM   1. MDD (major depressive disorder), recurrent, in partial remission (HCC)  F33.41   2. Anxiety  F41.9     Past Psychiatric History: MDD, anxiety  Past Medical History:  Past Medical History:  Diagnosis Date  . Agoraphobia with panic disorder   . Generalized anxiety disorder   . Grief   . History of posttraumatic stress disorder (PTSD)   . Insomnia   . Major depressive disorder, recurrent episode, moderate (HCC)   . Persistent insomnia   . Posttraumatic stress disorder     Past Surgical History:  Procedure Laterality Date  . ABDOMINAL HYSTERECTOMY    . APPENDECTOMY    . COLONOSCOPY WITH PROPOFOL N/A 02/24/2019   Procedure: COLONOSCOPY WITH PROPOFOL;  Surgeon: Christena Deem, MD;  Location: [facility] ENDOSCOPY;  Service: Endoscopy;  Laterality: N/A;  . KIDNEY SURGERY      Family Psychiatric History: see below  Family History:  Family History  Problem  Relation Age of Onset  . Depression Father   . Alcohol abuse Maternal Grandmother   . Alcohol abuse Paternal Grandmother   . Depression Paternal Grandmother     Social History:  Social History   Socioeconomic History  . Marital status: Married    Spouse name: Chrissie Noa  . Number of children: 1  . Years of education: Not on file  . Highest education level: High school graduate  Occupational History  . Not on file  Tobacco Use  . Smoking status: Former Games developer  . Smokeless tobacco: Never Used  Vaping Use  . Vaping Use: Never used  Substance and Sexual Activity  . Alcohol use: No  . Drug use: No  . Sexual activity: Yes    Partners: Male    Birth control/protection: None  Other Topics Concern  . Not on file  Social History Narrative  . Not on file   Social Determinants of Health   Financial Resource Strain: Low Risk   . Difficulty of Paying Living Expenses: Not hard at all  Food Insecurity: No Food Insecurity  . Worried About Programme researcher, broadcasting/film/video in the Last Year: Never true  . Ran Out of Food in the Last Year: Never true  Transportation Needs: No Transportation Needs  . Lack of Transportation (Medical): No  . Lack of Transportation (Non-Medical): No  Physical Activity: Inactive  . Days of Exercise per Week: 0 days  .  Minutes of Exercise per Session: 0 min  Stress:   . Feeling of Stress :   Social Connections: Unknown  . Frequency of Communication with Friends and Family: Not on file  . Frequency of Social Gatherings with Friends and Family: Not on file  . Attends Religious Services: 1 to 4 times per year  . Active Member of Clubs or Organizations: Yes  . Attends Banker Meetings: 1 to 4 times per year  . Marital Status: Married    Allergies:  Allergies  Allergen Reactions  . Ciprofloxacin Nausea And Vomiting  . Hydrocodone-Acetaminophen Hives, Nausea And Vomiting and Rash  . Sulfa Antibiotics Rash  . Zolpidem Other (See Comments)    Getting up in  the middle of night sleep walking , trying to use appliances , knives etc.  . Zolpidem Tartrate Other (See Comments)    Getting up in the middle of night sleep walking , trying to use appliances , knives etc.  . Compazine  [Prochlorperazine]   . Prochlorperazine Maleate     Muscle spasm  . Sulfamethoxazole   . Sulfur   . Vancomycin Other (See Comments)    Nausea, fatigue, mouth sores.    Metabolic Disorder Labs: No results found for: HGBA1C, MPG No results found for: PROLACTIN No results found for: CHOL, TRIG, HDL, CHOLHDL, VLDL, LDLCALC No results found for: TSH  Therapeutic Level Labs: No results found for: LITHIUM No results found for: VALPROATE No components found for:  CBMZ  Current Medications: Current Outpatient Medications  Medication Sig Dispense Refill  . acyclovir ointment (ZOVIRAX) 5 % APPLY 1 GRAM TOPICALLY EVERY 3 HOURS AS NEEDED    . brexpiprazole (REXULTI) 1 MG TABS tablet Take 1 tablet (1 mg total) by mouth daily. 90 tablet 0  . busPIRone (BUSPAR) 15 MG tablet Take 1 tablet (15 mg total) by mouth 2 (two) times daily. 180 tablet 0  . cholestyramine (QUESTRAN) 4 g packet Take by mouth.    . clonazePAM (KLONOPIN) 0.5 MG tablet Take 1 tablet (0.5 mg total) by mouth 2 (two) times daily as needed for anxiety. 60 tablet 1  . diphenoxylate-atropine (LOMOTIL) 2.5-0.025 MG per tablet TAKE 1 TABLET BY MOUTH 4 TIMES A DAY AS NEEDED FOR DIARRHEA  5  . estrogens, conjugated, (PREMARIN) 1.25 MG tablet Take by mouth.    . itraconazole (SPORANOX) 100 MG capsule Take by mouth.    . levalbuterol (XOPENEX HFA) 45 MCG/ACT inhaler Inhale 2 puffs into the lungs every 4 (four) hours as needed for wheezing.    Marland Kitchen levothyroxine (SYNTHROID) 50 MCG tablet Take by mouth.    . Multiple Vitamins-Minerals (MULTI ADULT GUMMIES) CHEW Chew by mouth.    . phenazopyridine (PYRIDIUM) 100 MG tablet Take 100 mg by mouth.    . potassium citrate (UROCIT-K) 10 MEQ (1080 MG) SR tablet Take 2 tablets (20  mEq total) by mouth 2 (two) times daily. 120 tablet 11  . traZODone (DESYREL) 150 MG tablet Take 1 tablet (150 mg total) by mouth at bedtime. 90 tablet 0  . venlafaxine XR (EFFEXOR XR) 75 MG 24 hr capsule Take 3 capsules in the morning 270 capsule 0   No current facility-administered medications for this visit.     Musculoskeletal: Strength & Muscle Tone: unable to assess due to telemed visit Gait & Station: unable to assess due to telemed visit Patient leans: unable to assess due to telemed visit  Psychiatric Specialty Exam: Review of Systems  There were no vitals taken  for this visit.There is no height or weight on file to calculate BMI.  General Appearance: Fairly Groomed  Eye Contact:  Good  Speech:  Clear and Coherent and Normal Rate  Volume:  Normal  Mood: "I am better"  Affect:  Congruent, smiling on occassion  Thought Process:  Goal Directed, Linear and Descriptions of Associations: Intact  Orientation:  Full (Time, Place, and Person)  Thought Content: Logical   Suicidal Thoughts:  No  Homicidal Thoughts:  No  Memory:  Recent;   Good Remote;   Good  Judgement:  Fair  Insight:  Fair  Psychomotor Activity:  Normal  Concentration:  Concentration: Good and Attention Span: Good  Recall:  Good  Fund of Knowledge: Good  Language: Good  Akathisia:  Negative  Handed:  Right  AIMS (if indicated): not done  Assets:  Communication Skills Desire for Improvement Financial Resources/Insurance Housing  ADL's:  Intact  Cognition: WNL  Sleep:  Good with help of higher dose of trazodone    Assessment and Plan: Patient doing fairly well on the current regimen.   1. MDD (major depressive disorder), recurrent episode, moderate (HCC)  - venlafaxine XR (EFFEXOR XR) 75 MG 24 hr capsule; Take 3 capsules in the morning  Dispense: 270 capsule; Refill: 0 - traZODone (DESYREL) 150 MG tablet; Take 1 tablet (150 mg total) by mouth at bedtime.  Dispense: 90 tablet; Refill: 0 -  brexpiprazole (REXULTI) 1 MG TABS tablet; Take 1 tablet (1 mg total) by mouth daily.  Dispense: 90 tablet; Refill: 0  2. Anxiety  - clonazePAM (KLONOPIN) 0.5 MG tablet; Take 1 tablet (0.5 mg total) by mouth 2 (two) times daily as needed for anxiety.  Dispense: 60 tablet; Refill: 1 - venlafaxine XR (EFFEXOR XR) 75 MG 24 hr capsule; Take 3 capsules in the morning  Dispense: 270 capsule; Refill: 0 - busPIRone (BUSPAR) 15 MG tablet; Take 1 tablet (15 mg total) by mouth 2 (two) times daily.  Dispense: 180 tablet; Refill: 0   Continue individual therapy with Ms. Inetta Fermo. Follow up in 2 months.   Zena Amos, MD 01/06/2020, 3:06 PM

## 2020-01-27 ENCOUNTER — Telehealth (HOSPITAL_COMMUNITY): Payer: Self-pay | Admitting: *Deleted

## 2020-01-27 DIAGNOSIS — F419 Anxiety disorder, unspecified: Secondary | ICD-10-CM

## 2020-01-27 NOTE — Telephone Encounter (Signed)
Received a fax request for Clonazepam for a 90 day supply. She currently has a month RX with refill but it is more economical to get 90 days. Will forward this request to Dr Evelene Croon.

## 2020-01-27 NOTE — Telephone Encounter (Signed)
Which pharmacy ? [facility] or [facility] in [place] ?

## 2020-01-27 NOTE — Telephone Encounter (Signed)
[facility]

## 2020-01-28 MED ORDER — CLONAZEPAM 0.5 MG PO TABS: 0.5000 mg | ORAL_TABLET | Freq: Two times a day (BID) | ORAL | 0 refills | Status: DC | PRN

## 2020-01-28 NOTE — Addendum Note (Signed)
Addended by: Zena Amos on: 01/28/2020 08:44 AM   Modules accepted: Orders

## 2020-01-28 NOTE — Telephone Encounter (Signed)
90 day prescription for clonazepam sent to [facility] as per pt request.

## 2020-03-09 ENCOUNTER — Telehealth (INDEPENDENT_AMBULATORY_CARE_PROVIDER_SITE_OTHER): Payer: 59 | Admitting: Psychiatry

## 2020-03-09 ENCOUNTER — Encounter (HOSPITAL_COMMUNITY): Payer: Self-pay | Admitting: Psychiatry

## 2020-03-09 ENCOUNTER — Other Ambulatory Visit: Payer: Self-pay

## 2020-03-09 DIAGNOSIS — F3341 Major depressive disorder, recurrent, in partial remission: Secondary | ICD-10-CM

## 2020-03-09 DIAGNOSIS — F419 Anxiety disorder, unspecified: Secondary | ICD-10-CM

## 2020-03-09 MED ORDER — TRAZODONE HCL 150 MG PO TABS: 150.0000 mg | ORAL_TABLET | Freq: Every day | ORAL | 0 refills | Status: DC

## 2020-03-09 MED ORDER — VENLAFAXINE HCL ER 75 MG PO CP24: ORAL_CAPSULE | ORAL | 0 refills | Status: DC

## 2020-03-09 MED ORDER — CLONAZEPAM 0.5 MG PO TABS: 0.5000 mg | ORAL_TABLET | Freq: Two times a day (BID) | ORAL | 0 refills | Status: DC | PRN

## 2020-03-09 MED ORDER — BUSPIRONE HCL 15 MG PO TABS: 15.0000 mg | ORAL_TABLET | Freq: Two times a day (BID) | ORAL | 0 refills | Status: DC

## 2020-03-09 MED ORDER — BREXPIPRAZOLE 1 MG PO TABS: 1.0000 mg | ORAL_TABLET | Freq: Every day | ORAL | 0 refills | Status: DC

## 2020-03-09 NOTE — Progress Notes (Signed)
BH MD OP Progress Note  Virtual Visit via Video Note  I connected with Carol Hensley on 03/09/20 at  3:20 PM EDT by a video enabled telemedicine application and verified that I am speaking with the correct person using two identifiers.  Location: Patient: Home Provider: Clinic   I discussed the limitations of evaluation and management by telemedicine and the availability of in person appointments. The patient expressed understanding and agreed to proceed.  I provided 14 minutes of non-face-to-face time during this encounter.     03/09/2020 3:28 PM Carol Hensley  MRN:  [id number]  Chief Complaint: " I am doing okay."  HPI: Patient reported that her husband is doing fairly well.  Her mother is still not doing well.  She stated that she is trying her best to manage everything.  Her other family numbers have been helpful with her mother.  She stated that she is satisfied with the current regimen and would like to keep things the way they are.  Visit Diagnosis:    ICD-10-CM   1. MDD (major depressive disorder), recurrent, in partial remission (HCC)  F33.41   2. Anxiety  F41.9     Past Psychiatric History: MDD, anxiety  Past Medical History:  Past Medical History:  Diagnosis Date  . Agoraphobia with panic disorder   . Generalized anxiety disorder   . Grief   . History of posttraumatic stress disorder (PTSD)   . Insomnia   . Major depressive disorder, recurrent episode, moderate (HCC)   . Persistent insomnia   . Posttraumatic stress disorder     Past Surgical History:  Procedure Laterality Date  . ABDOMINAL HYSTERECTOMY    . APPENDECTOMY    . COLONOSCOPY WITH PROPOFOL N/A 02/24/2019   Procedure: COLONOSCOPY WITH PROPOFOL;  Surgeon: Christena Deem, MD;  Location: [facility] ENDOSCOPY;  Service: Endoscopy;  Laterality: N/A;  . KIDNEY SURGERY      Family Psychiatric History: see below  Family History:  Family History  Problem Relation Age of Onset  . Depression  Father   . Alcohol abuse Maternal Grandmother   . Alcohol abuse Paternal Grandmother   . Depression Paternal Grandmother     Social History:  Social History   Socioeconomic History  . Marital status: Married    Spouse name: Chrissie Noa  . Number of children: 1  . Years of education: Not on file  . Highest education level: High school graduate  Occupational History  . Not on file  Tobacco Use  . Smoking status: Former Games developer  . Smokeless tobacco: Never Used  Vaping Use  . Vaping Use: Never used  Substance and Sexual Activity  . Alcohol use: No  . Drug use: No  . Sexual activity: Yes    Partners: Male    Birth control/protection: None  Other Topics Concern  . Not on file  Social History Narrative  . Not on file   Social Determinants of Health   Financial Resource Strain: Low Risk   . Difficulty of Paying Living Expenses: Not hard at all  Food Insecurity: No Food Insecurity  . Worried About Programme researcher, broadcasting/film/video in the Last Year: Never true  . Ran Out of Food in the Last Year: Never true  Transportation Needs: No Transportation Needs  . Lack of Transportation (Medical): No  . Lack of Transportation (Non-Medical): No  Physical Activity: Inactive  . Days of Exercise per Week: 0 days  . Minutes of Exercise per Session: 0 min  Stress:   .  Feeling of Stress : Not on file  Social Connections: Unknown  . Frequency of Communication with Friends and Family: Not on file  . Frequency of Social Gatherings with Friends and Family: Not on file  . Attends Religious Services: 1 to 4 times per year  . Active Member of Clubs or Organizations: Yes  . Attends Banker Meetings: 1 to 4 times per year  . Marital Status: Married    Allergies:  Allergies  Allergen Reactions  . Ciprofloxacin Nausea And Vomiting  . Hydrocodone-Acetaminophen Hives, Nausea And Vomiting and Rash  . Sulfa Antibiotics Rash  . Zolpidem Other (See Comments)    Getting up in the middle of night sleep  walking , trying to use appliances , knives etc.  . Zolpidem Tartrate Other (See Comments)    Getting up in the middle of night sleep walking , trying to use appliances , knives etc.  . Compazine  [Prochlorperazine]   . Prochlorperazine Maleate     Muscle spasm  . Sulfamethoxazole   . Sulfur   . Vancomycin Other (See Comments)    Nausea, fatigue, mouth sores.    Metabolic Disorder Labs: No results found for: HGBA1C, MPG No results found for: PROLACTIN No results found for: CHOL, TRIG, HDL, CHOLHDL, VLDL, LDLCALC No results found for: TSH  Therapeutic Level Labs: No results found for: LITHIUM No results found for: VALPROATE No components found for:  CBMZ  Current Medications: Current Outpatient Medications  Medication Sig Dispense Refill  . acyclovir ointment (ZOVIRAX) 5 % APPLY 1 GRAM TOPICALLY EVERY 3 HOURS AS NEEDED    . brexpiprazole (REXULTI) 1 MG TABS tablet Take 1 tablet (1 mg total) by mouth daily. 90 tablet 0  . busPIRone (BUSPAR) 15 MG tablet Take 1 tablet (15 mg total) by mouth 2 (two) times daily. 180 tablet 0  . cholestyramine (QUESTRAN) 4 g packet Take by mouth.    . clonazePAM (KLONOPIN) 0.5 MG tablet Take 1 tablet (0.5 mg total) by mouth 2 (two) times daily as needed for anxiety. 180 tablet 0  . diphenoxylate-atropine (LOMOTIL) 2.5-0.025 MG per tablet TAKE 1 TABLET BY MOUTH 4 TIMES A DAY AS NEEDED FOR DIARRHEA  5  . estrogens, conjugated, (PREMARIN) 1.25 MG tablet Take by mouth.    . itraconazole (SPORANOX) 100 MG capsule Take by mouth.    . levalbuterol (XOPENEX HFA) 45 MCG/ACT inhaler Inhale 2 puffs into the lungs every 4 (four) hours as needed for wheezing.    Marland Kitchen levothyroxine (SYNTHROID) 50 MCG tablet Take by mouth.    . Multiple Vitamins-Minerals (MULTI ADULT GUMMIES) CHEW Chew by mouth.    . phenazopyridine (PYRIDIUM) 100 MG tablet Take 100 mg by mouth.    . potassium citrate (UROCIT-K) 10 MEQ (1080 MG) SR tablet Take 2 tablets (20 mEq total) by mouth 2 (two)  times daily. 120 tablet 11  . traZODone (DESYREL) 150 MG tablet Take 1 tablet (150 mg total) by mouth at bedtime. 90 tablet 0  . venlafaxine XR (EFFEXOR XR) 75 MG 24 hr capsule Take 3 capsules in the morning 270 capsule 0   No current facility-administered medications for this visit.     Musculoskeletal: Strength & Muscle Tone: unable to assess due to telemed visit Gait & Station: unable to assess due to telemed visit Patient leans: unable to assess due to telemed visit  Psychiatric Specialty Exam: Review of Systems  There were no vitals taken for this visit.There is no height or weight on file  to calculate BMI.  General Appearance: Well Groomed  Eye Contact:  Good  Speech:  Clear and Coherent and Normal Rate  Volume:  Normal  Mood: Euthymic  Affect:  Congruent  Thought Process:  Goal Directed, Linear and Descriptions of Associations: Intact  Orientation:  Full (Time, Place, and Person)  Thought Content: Logical   Suicidal Thoughts:  No  Homicidal Thoughts:  No  Memory:  Recent;   Good Remote;   Good  Judgement:  Fair  Insight:  Fair  Psychomotor Activity:  Normal  Concentration:  Concentration: Good and Attention Span: Good  Recall:  Good  Fund of Knowledge: Good  Language: Good  Akathisia:  Negative  Handed:  Right  AIMS (if indicated): not done  Assets:  Communication Skills Desire for Improvement Financial Resources/Insurance Housing  ADL's:  Intact  Cognition: WNL  Sleep:  Good with help of higher dose of trazodone    Assessment and Plan: Patient doing fairly well on the current regimen.   1. MDD (major depressive disorder), recurrent episode, moderate (HCC)  - venlafaxine XR (EFFEXOR XR) 75 MG 24 hr capsule; Take 3 capsules in the morning  Dispense: 270 capsule; Refill: 0 - traZODone (DESYREL) 150 MG tablet; Take 1 tablet (150 mg total) by mouth at bedtime.  Dispense: 90 tablet; Refill: 0 - brexpiprazole (REXULTI) 1 MG TABS tablet; Take 1 tablet (1 mg  total) by mouth daily.  Dispense: 90 tablet; Refill: 0  2. Anxiety  - clonazePAM (KLONOPIN) 0.5 MG tablet; Take 1 tablet (0.5 mg total) by mouth 2 (two) times daily as needed for anxiety.  Dispense: 60 tablet; Refill: 1 - venlafaxine XR (EFFEXOR XR) 75 MG 24 hr capsule; Take 3 capsules in the morning  Dispense: 270 capsule; Refill: 0 - busPIRone (BUSPAR) 15 MG tablet; Take 1 tablet (15 mg total) by mouth 2 (two) times daily.  Dispense: 180 tablet; Refill: 0  Continue same regimen. Continue individual therapy with Ms. Inetta Fermo. Follow up in 2 months.   Zena Amos, MD 03/09/2020, 3:28 PM

## 2020-05-10 ENCOUNTER — Other Ambulatory Visit: Payer: Self-pay

## 2020-05-10 ENCOUNTER — Telehealth (HOSPITAL_COMMUNITY): Payer: 59 | Admitting: Psychiatry

## 2020-06-28 DIAGNOSIS — R7303 Prediabetes: Secondary | ICD-10-CM | POA: Insufficient documentation

## 2020-06-30 ENCOUNTER — Other Ambulatory Visit: Payer: Self-pay

## 2020-06-30 ENCOUNTER — Telehealth (HOSPITAL_COMMUNITY): Payer: 59 | Admitting: Psychiatry

## 2020-07-01 ENCOUNTER — Other Ambulatory Visit: Payer: Self-pay

## 2020-07-01 ENCOUNTER — Telehealth (INDEPENDENT_AMBULATORY_CARE_PROVIDER_SITE_OTHER): Payer: Self-pay | Admitting: Psychiatry

## 2020-07-01 ENCOUNTER — Encounter (HOSPITAL_COMMUNITY): Payer: Self-pay | Admitting: Psychiatry

## 2020-07-01 DIAGNOSIS — F3341 Major depressive disorder, recurrent, in partial remission: Secondary | ICD-10-CM

## 2020-07-01 DIAGNOSIS — F419 Anxiety disorder, unspecified: Secondary | ICD-10-CM

## 2020-07-01 MED ORDER — VENLAFAXINE HCL ER 75 MG PO CP24: ORAL_CAPSULE | ORAL | 0 refills | Status: DC

## 2020-07-01 MED ORDER — CLONAZEPAM 0.5 MG PO TABS: 0.5000 mg | ORAL_TABLET | Freq: Two times a day (BID) | ORAL | 0 refills | Status: DC | PRN

## 2020-07-01 MED ORDER — BUSPIRONE HCL 15 MG PO TABS: 15.0000 mg | ORAL_TABLET | Freq: Two times a day (BID) | ORAL | 0 refills | Status: DC

## 2020-07-01 MED ORDER — BREXPIPRAZOLE 1 MG PO TABS: 1.0000 mg | ORAL_TABLET | Freq: Every day | ORAL | 0 refills | Status: DC

## 2020-07-01 NOTE — Progress Notes (Signed)
BH MD OP Progress Note  Virtual Visit via Video Note  I connected with Carol Hensley on 07/01/20 at  2:40 PM EST by a video enabled telemedicine application and verified that I am speaking with the correct person using two identifiers.  Location: Patient: Home Provider: Clinic   I discussed the limitations of evaluation and management by telemedicine and the availability of in person appointments. The patient expressed understanding and agreed to proceed.  I provided 14 minutes of non-face-to-face time during this encounter.     07/01/2020 2:53 PM Carol Hensley  MRN:  [id number]  Chief Complaint: " This morning we were told that my husband only has 3 weeks left."  HPI: Patient was very upset and tearful throughout the session.  She informed that this morning her husband's doctor informed them that he has 3 weeks left and that his prognosis is not good.  Her husband has been dealing with lung carcinoma for a while now.  Patient stated that she has shared this news with their daughter and they are going to talk to each other to discuss what the need to do in the near future. Writer provided support to the patient and encouraged her to just take one day at a time and not to fixate on what the future may or may not hold for them as a family. Patient verbalized understanding.  She was agreeable to touching base next month.   Visit Diagnosis:    ICD-10-CM   1. MDD (major depressive disorder), recurrent, in partial remission (HCC)  F33.41   2. Anxiety  F41.9     Past Psychiatric History: MDD, anxiety  Past Medical History:  Past Medical History:  Diagnosis Date  . Agoraphobia with panic disorder   . Generalized anxiety disorder   . Grief   . History of posttraumatic stress disorder (PTSD)   . Insomnia   . Major depressive disorder, recurrent episode, moderate (HCC)   . Persistent insomnia   . Posttraumatic stress disorder     Past Surgical History:  Procedure  Laterality Date  . ABDOMINAL HYSTERECTOMY    . APPENDECTOMY    . COLONOSCOPY WITH PROPOFOL N/A 02/24/2019   Procedure: COLONOSCOPY WITH PROPOFOL;  Surgeon: Christena Deem, MD;  Location: [facility] ENDOSCOPY;  Service: Endoscopy;  Laterality: N/A;  . KIDNEY SURGERY      Family Psychiatric History: see below  Family History:  Family History  Problem Relation Age of Onset  . Depression Father   . Alcohol abuse Maternal Grandmother   . Alcohol abuse Paternal Grandmother   . Depression Paternal Grandmother     Social History:  Social History   Socioeconomic History  . Marital status: Married    Spouse name: Chrissie Noa  . Number of children: 1  . Years of education: Not on file  . Highest education level: High school graduate  Occupational History  . Not on file  Tobacco Use  . Smoking status: Former Games developer  . Smokeless tobacco: Never Used  Vaping Use  . Vaping Use: Never used  Substance and Sexual Activity  . Alcohol use: No  . Drug use: No  . Sexual activity: Yes    Partners: Male    Birth control/protection: None  Other Topics Concern  . Not on file  Social History Narrative  . Not on file   Social Determinants of Health   Financial Resource Strain: Not on file  Food Insecurity: Not on file  Transportation Needs: Not on file  Physical Activity: Not on file  Stress: Not on file  Social Connections: Not on file    Allergies:  Allergies  Allergen Reactions  . Ciprofloxacin Nausea And Vomiting  . Hydrocodone-Acetaminophen Hives, Nausea And Vomiting and Rash  . Sulfa Antibiotics Rash  . Zolpidem Other (See Comments)    Getting up in the middle of night sleep walking , trying to use appliances , knives etc.  . Zolpidem Tartrate Other (See Comments)    Getting up in the middle of night sleep walking , trying to use appliances , knives etc.  . Compazine  [Prochlorperazine]   . Prochlorperazine Maleate     Muscle spasm  . Sulfamethoxazole   . Sulfur   .  Vancomycin Other (See Comments)    Nausea, fatigue, mouth sores.    Metabolic Disorder Labs: No results found for: HGBA1C, MPG No results found for: PROLACTIN No results found for: CHOL, TRIG, HDL, CHOLHDL, VLDL, LDLCALC No results found for: TSH  Therapeutic Level Labs: No results found for: LITHIUM No results found for: VALPROATE No components found for:  CBMZ  Current Medications: Current Outpatient Medications  Medication Sig Dispense Refill  . acyclovir ointment (ZOVIRAX) 5 % APPLY 1 GRAM TOPICALLY EVERY 3 HOURS AS NEEDED    . brexpiprazole (REXULTI) 1 MG TABS tablet Take 1 tablet (1 mg total) by mouth daily. 90 tablet 0  . busPIRone (BUSPAR) 15 MG tablet Take 1 tablet (15 mg total) by mouth 2 (two) times daily. 180 tablet 0  . cholestyramine (QUESTRAN) 4 g packet Take by mouth.    . clonazePAM (KLONOPIN) 0.5 MG tablet Take 1 tablet (0.5 mg total) by mouth 2 (two) times daily as needed for anxiety. 180 tablet 0  . diphenoxylate-atropine (LOMOTIL) 2.5-0.025 MG per tablet TAKE 1 TABLET BY MOUTH 4 TIMES A DAY AS NEEDED FOR DIARRHEA  5  . estrogens, conjugated, (PREMARIN) 1.25 MG tablet Take by mouth.    . itraconazole (SPORANOX) 100 MG capsule Take by mouth.    . levalbuterol (XOPENEX HFA) 45 MCG/ACT inhaler Inhale 2 puffs into the lungs every 4 (four) hours as needed for wheezing.    Marland Kitchen levothyroxine (SYNTHROID) 50 MCG tablet Take by mouth.    . Multiple Vitamins-Minerals (MULTI ADULT GUMMIES) CHEW Chew by mouth.    . phenazopyridine (PYRIDIUM) 100 MG tablet Take 100 mg by mouth.    . potassium citrate (UROCIT-K) 10 MEQ (1080 MG) SR tablet Take 2 tablets (20 mEq total) by mouth 2 (two) times daily. 120 tablet 11  . traZODone (DESYREL) 150 MG tablet Take 1 tablet (150 mg total) by mouth at bedtime. 90 tablet 0  . venlafaxine XR (EFFEXOR XR) 75 MG 24 hr capsule Take 3 capsules in the morning 270 capsule 0   No current facility-administered medications for this visit.      Musculoskeletal: Strength & Muscle Tone: unable to assess due to telemed visit Gait & Station: unable to assess due to telemed visit Patient leans: unable to assess due to telemed visit  Psychiatric Specialty Exam: Review of Systems  There were no vitals taken for this visit.There is no height or weight on file to calculate BMI.  General Appearance: Fairly Groomed  Eye Contact:  Good  Speech:  Clear and Coherent and Normal Rate  Volume:  Normal  Mood: Depressed, tearful  Affect:  Congruent  Thought Process:  Goal Directed, Linear and Descriptions of Associations: Intact  Orientation:  Full (Time, Place, and Person)  Thought Content: Logical  Suicidal Thoughts:  No  Homicidal Thoughts:  No  Memory:  Recent;   Good Remote;   Good  Judgement:  Fair  Insight:  Fair  Psychomotor Activity:  Normal  Concentration:  Concentration: Good and Attention Span: Good  Recall:  Good  Fund of Knowledge: Good  Language: Good  Akathisia:  Negative  Handed:  Right  AIMS (if indicated): not done  Assets:  Communication Skills Desire for Improvement Financial Resources/Insurance Housing  ADL's:  Intact  Cognition: WNL  Sleep:  Fair     Assessment and Plan: Patient is currently dealing with grave prognosis related to her husband's health condition.  She has been seeing her therapist Ms. Inetta Fermo regularly.  1. MDD (major depressive disorder), recurrent episode, moderate (HCC)  - venlafaxine XR (EFFEXOR XR) 75 MG 24 hr capsule; Take 3 capsules in the morning  Dispense: 270 capsule; Refill: 0 - traZODone (DESYREL) 150 MG tablet; Take 1 tablet (150 mg total) by mouth at bedtime.  Dispense: 90 tablet; Refill: 0 - brexpiprazole (REXULTI) 1 MG TABS tablet; Take 1 tablet (1 mg total) by mouth daily.  Dispense: 90 tablet; Refill: 0  2. Anxiety  - clonazePAM (KLONOPIN) 0.5 MG tablet; Take 1 tablet (0.5 mg total) by mouth 2 (two) times daily as needed for anxiety.  Dispense: 60 tablet; Refill:  1 - venlafaxine XR (EFFEXOR XR) 75 MG 24 hr capsule; Take 3 capsules in the morning  Dispense: 270 capsule; Refill: 0 - busPIRone (BUSPAR) 15 MG tablet; Take 1 tablet (15 mg total) by mouth 2 (two) times daily.  Dispense: 180 tablet; Refill: 0  Recommend to continue the same regimen for now. Continue individual therapy with Ms. Inetta Fermo. Follow up in 6-weeks.   Zena Amos, MD 07/01/2020, 2:53 PM

## 2020-08-03 ENCOUNTER — Other Ambulatory Visit: Payer: Self-pay

## 2020-08-03 ENCOUNTER — Ambulatory Visit
Admission: RE | Admit: 2020-08-03 | Discharge: 2020-08-03 | Disposition: A | Discharge status: Home / Self Care | Payer: 59 | Attending: Urology | Admitting: Urology

## 2020-08-03 ENCOUNTER — Ambulatory Visit
Admission: RE | Admit: 2020-08-03 | Discharge: 2020-08-03 | Disposition: A | Discharge status: Home / Self Care | Payer: 59 | Source: Ambulatory Visit | Attending: Urology | Admitting: Urology

## 2020-08-03 ENCOUNTER — Ambulatory Visit (INDEPENDENT_AMBULATORY_CARE_PROVIDER_SITE_OTHER): Payer: 59 | Admitting: Urology

## 2020-08-03 ENCOUNTER — Encounter: Payer: Self-pay | Admitting: Urology

## 2020-08-03 VITALS — BP 137/79 | HR 101 | Ht 59.0 in | Wt 140.0 lb

## 2020-08-03 DIAGNOSIS — N200 Calculus of kidney: Secondary | ICD-10-CM

## 2020-08-03 DIAGNOSIS — E7209 Other disorders of amino-acid transport: Secondary | ICD-10-CM

## 2020-08-03 MED ORDER — POTASSIUM CITRATE ER 10 MEQ (1080 MG) PO TBCR: 20.0000 meq | EXTENDED_RELEASE_TABLET | Freq: Two times a day (BID) | ORAL | 3 refills | Status: DC

## 2020-08-03 NOTE — Progress Notes (Signed)
   08/03/2020 1:29 PM   Carol Hensley 09-14-1960 [id number]  Reason for visit: Follow up cystine nephrolithiasis  HPI: I saw Ms. for him back for routine follow-up of cystine nephrolithiasis.  Her prior urologist was at [facility], and those records are not available to me.  She is had stones since she was a teenager, and has had over 10 surgeries for kidney stones.  She has been managed very well over the last 5 years on 20 mEq potassium citrate twice daily and has not had any stone episodes.  She denies any problems over the last year and is doing well.  I personally reviewed her KUB today that shows a stable 3 mm right midpole stone unchanged from prior.  We reviewed stone prevention strategies in the setting of her recurrent cystine stones including high fluid volume, low-salt diet, avoiding high protein diets, and compliance with potassium citrate.  She continues to do an excellent job and has not had stones in over 5 years.  Potassium citrate 20 mEq twice daily refilled RTC 1 year with KUB prior, UA to check pH  Sondra Come, MD  [facility] [address] [place], [place] [address] [phone]

## 2020-08-09 ENCOUNTER — Telehealth (HOSPITAL_COMMUNITY): Payer: Self-pay

## 2020-08-09 DIAGNOSIS — F419 Anxiety disorder, unspecified: Secondary | ICD-10-CM

## 2020-08-09 NOTE — Telephone Encounter (Signed)
Patient called regarding her Buspirone 15mg  that was sent to [facility]. Patient stated that she doesn't have insurance through them anymore and would like it resent to [facility] on [address] in [place]. Followup scheduled for 2/16. Please review and advise. Thank you

## 2020-08-10 ENCOUNTER — Telehealth (HOSPITAL_COMMUNITY): Payer: Self-pay

## 2020-08-10 MED ORDER — BUSPIRONE HCL 15 MG PO TABS: 15.0000 mg | ORAL_TABLET | Freq: Two times a day (BID) | ORAL | 0 refills | Status: DC

## 2020-08-10 NOTE — Telephone Encounter (Signed)
Rx sent. Can you contact her to tell her that her appt has been moved to Feb 17 at 9 am due my schedule conflict. Thanks.

## 2020-08-10 NOTE — Telephone Encounter (Signed)
Notified patient of new appointment date and time. Also, that her medication was sent in. Patient didn't pick up so I left a voicemail message

## 2020-08-11 ENCOUNTER — Telehealth (HOSPITAL_COMMUNITY): Payer: Self-pay | Admitting: Psychiatry

## 2020-08-11 NOTE — Telephone Encounter (Signed)
Thanks

## 2020-08-12 ENCOUNTER — Telehealth (HOSPITAL_COMMUNITY): Payer: 59 | Admitting: Psychiatry

## 2020-08-12 ENCOUNTER — Other Ambulatory Visit: Payer: Self-pay

## 2020-08-23 ENCOUNTER — Telehealth (HOSPITAL_COMMUNITY): Payer: Self-pay | Admitting: *Deleted

## 2020-08-23 DIAGNOSIS — F3341 Major depressive disorder, recurrent, in partial remission: Secondary | ICD-10-CM

## 2020-08-23 MED ORDER — BREXPIPRAZOLE 1 MG PO TABS: 1.0000 mg | ORAL_TABLET | Freq: Every day | ORAL | 0 refills | Status: DC

## 2020-08-23 NOTE — Telephone Encounter (Signed)
CHART OPENED TO COMPLETE A PRIOR AUTHORIZATION

## 2020-08-23 NOTE — Telephone Encounter (Signed)
Call from patient needing her Rexulti rx. Checked chart and called her back, rx written on 1/6 for 90 days should not be out. Checked to see the rx had been called to a mail order pharmacy and she said she no longer has insurance to cover the mail order pharmacy. She will need rx called to [facility] in [place]. Will bring this to Dr Magdalen Spatz attention.

## 2020-08-23 NOTE — Telephone Encounter (Signed)
Rx sent 

## 2020-08-23 NOTE — Addendum Note (Signed)
Addended by: Zena Amos on: 08/23/2020 10:59 AM   Modules accepted: Orders

## 2020-08-27 ENCOUNTER — Telehealth (HOSPITAL_COMMUNITY): Payer: Self-pay | Admitting: *Deleted

## 2020-08-27 NOTE — Telephone Encounter (Signed)
[employer] Appeal Approval [id number]. Request coverage for brexpiprazole (REXULTI) 1 MG TABS tablet has been approved.    Effective:  08/27/20  To  08/26/21

## 2020-09-01 ENCOUNTER — Telehealth (INDEPENDENT_AMBULATORY_CARE_PROVIDER_SITE_OTHER): Payer: 59 | Admitting: Psychiatry

## 2020-09-01 ENCOUNTER — Other Ambulatory Visit: Payer: Self-pay

## 2020-09-01 ENCOUNTER — Encounter (HOSPITAL_COMMUNITY): Payer: Self-pay | Admitting: Psychiatry

## 2020-09-01 DIAGNOSIS — F3341 Major depressive disorder, recurrent, in partial remission: Secondary | ICD-10-CM

## 2020-09-01 DIAGNOSIS — F4320 Adjustment disorder, unspecified: Secondary | ICD-10-CM | POA: Diagnosis not present

## 2020-09-01 DIAGNOSIS — F419 Anxiety disorder, unspecified: Secondary | ICD-10-CM

## 2020-09-01 MED ORDER — BREXPIPRAZOLE 1 MG PO TABS: 1.0000 mg | ORAL_TABLET | Freq: Every day | ORAL | 0 refills | Status: DC

## 2020-09-01 MED ORDER — VENLAFAXINE HCL ER 75 MG PO CP24: ORAL_CAPSULE | ORAL | 0 refills | Status: DC

## 2020-09-01 MED ORDER — CLONAZEPAM 0.5 MG PO TABS: 0.5000 mg | ORAL_TABLET | Freq: Two times a day (BID) | ORAL | 0 refills | Status: DC | PRN

## 2020-09-01 MED ORDER — BUSPIRONE HCL 15 MG PO TABS: 15.0000 mg | ORAL_TABLET | Freq: Two times a day (BID) | ORAL | 0 refills | Status: DC

## 2020-09-01 MED ORDER — TRAZODONE HCL 150 MG PO TABS: 150.0000 mg | ORAL_TABLET | Freq: Every day | ORAL | 0 refills | Status: DC

## 2020-09-01 NOTE — Progress Notes (Signed)
BH MD OP Progress Note  Virtual Visit via Video Note  I connected with SANJNA HASKEW on 09/01/20 at  8:40 AM EST by a video enabled telemedicine application and verified that I am speaking with the correct person using two identifiers.  Location: Patient: Home Provider: Clinic   I discussed the limitations of evaluation and management by telemedicine and the availability of in person appointments. The patient expressed understanding and agreed to proceed.  I provided 16 minutes of non-face-to-face time during this encounter.       09/01/2020 8:46 AM ELAISHA ZAHNISER  MRN:  [id number]  Chief Complaint:  " My husband is not doing too good."  HPI: Patient stated that her husband is now doing well and is currently on hospice care.  She stated that she is taking 1 very time because 2 days is too much for her to handle.  She stated that she is just taking day by day and trying to go with the flow. She really fears the worst and explained that her husband makes full recovery so that she can be with him for rest of her life. Her daughter is supportive however is also busy taking care of her grandmother who is in a nursing home facility.  Writer informed the patient that Clinical research associate is here to help and support her and that she can call the clinic anytime she wants to.  She has been seeing her therapist Ms. Janee Morn regularly and finds it helpful. Writer also informed her that the office staff is appealed the prior authorization rejection for Rexulti.  Writer informed her that in case she runs out and is unable to be filled from the pharmacy then she can contact the office and we will be happy to provide her with samples.   Visit Diagnosis:    ICD-10-CM   1. MDD (major depressive disorder), recurrent, in partial remission (HCC)  F33.41 brexpiprazole (REXULTI) 1 MG TABS tablet    venlafaxine XR (EFFEXOR XR) 75 MG 24 hr capsule    traZODone (DESYREL) 150 MG tablet  2. Anxiety  F41.9  busPIRone (BUSPAR) 15 MG tablet    clonazePAM (KLONOPIN) 0.5 MG tablet    venlafaxine XR (EFFEXOR XR) 75 MG 24 hr capsule  3. Anticipatory grief  F43.20     Past Psychiatric History: MDD, anxiety  Past Medical History:  Past Medical History:  Diagnosis Date  . Agoraphobia with panic disorder   . Generalized anxiety disorder   . Grief   . History of posttraumatic stress disorder (PTSD)   . Insomnia   . Major depressive disorder, recurrent episode, moderate (HCC)   . Persistent insomnia   . Posttraumatic stress disorder     Past Surgical History:  Procedure Laterality Date  . ABDOMINAL HYSTERECTOMY    . APPENDECTOMY    . COLONOSCOPY WITH PROPOFOL N/A 02/24/2019   Procedure: COLONOSCOPY WITH PROPOFOL;  Surgeon: Christena Deem, MD;  Location: [facility] ENDOSCOPY;  Service: Endoscopy;  Laterality: N/A;  . KIDNEY SURGERY      Family Psychiatric History: see below  Family History:  Family History  Problem Relation Age of Onset  . Depression Father   . Alcohol abuse Maternal Grandmother   . Alcohol abuse Paternal Grandmother   . Depression Paternal Grandmother     Social History:  Social History   Socioeconomic History  . Marital status: Married    Spouse name: Chrissie Noa  . Number of children: 1  . Years of education: Not on file  .  Highest education level: High school graduate  Occupational History  . Not on file  Tobacco Use  . Smoking status: Former Games developer  . Smokeless tobacco: Never Used  Vaping Use  . Vaping Use: Never used  Substance and Sexual Activity  . Alcohol use: No  . Drug use: No  . Sexual activity: Yes    Partners: Male    Birth control/protection: None  Other Topics Concern  . Not on file  Social History Narrative  . Not on file   Social Determinants of Health   Financial Resource Strain: Not on file  Food Insecurity: Not on file  Transportation Needs: Not on file  Physical Activity: Not on file  Stress: Not on file  Social Connections:  Not on file    Allergies:  Allergies  Allergen Reactions  . Ciprofloxacin Nausea And Vomiting and Hives  . Hydrocodone-Acetaminophen Hives, Nausea And Vomiting and Rash  . Sulfa Antibiotics Rash  . Zolpidem Other (See Comments)    Getting up in the middle of night sleep walking , trying to use appliances , knives etc.  . Zolpidem Tartrate Other (See Comments)    Getting up in the middle of night sleep walking , trying to use appliances , knives etc.  . Compazine  [Prochlorperazine]   . Elemental Sulfur   . Prochlorperazine Maleate     Muscle spasm  . Sulfamethoxazole   . Vancomycin Other (See Comments)    Nausea, fatigue, mouth sores.    Metabolic Disorder Labs: No results found for: HGBA1C, MPG No results found for: PROLACTIN No results found for: CHOL, TRIG, HDL, CHOLHDL, VLDL, LDLCALC No results found for: TSH  Therapeutic Level Labs: No results found for: LITHIUM No results found for: VALPROATE No components found for:  CBMZ  Current Medications: Current Outpatient Medications  Medication Sig Dispense Refill  . acyclovir ointment (ZOVIRAX) 5 % APPLY 1 GRAM TOPICALLY EVERY 3 HOURS AS NEEDED    . brexpiprazole (REXULTI) 1 MG TABS tablet Take 1 tablet (1 mg total) by mouth daily. 90 tablet 0  . busPIRone (BUSPAR) 15 MG tablet Take 1 tablet (15 mg total) by mouth 2 (two) times daily. 180 tablet 0  . cholestyramine (QUESTRAN) 4 g packet Take by mouth.    . clonazePAM (KLONOPIN) 0.5 MG tablet Take 1 tablet (0.5 mg total) by mouth 2 (two) times daily as needed for anxiety. 180 tablet 0  . estradiol (ESTRACE) 0.5 MG tablet Take 0.5 mg by mouth daily.    . Estradiol 10 MCG TABS vaginal tablet Place 1 tablet vaginally 2 (two) times a week.    . estrogens, conjugated, (PREMARIN) 1.25 MG tablet Take by mouth.    . itraconazole (SPORANOX) 100 MG capsule Take by mouth.    . lipase/protease/amylase (CREON) 36000 UNITS CPEP capsule 2 CAPS DURING EACH MEAL, AND 1 CAP DURING [facility]  SNACK. TAKE DURING EVERY MEAL & SNACK.    Marland Kitchen potassium citrate (UROCIT-K) 10 MEQ (1080 MG) SR tablet Take 2 tablets (20 mEq total) by mouth 2 (two) times daily. 360 tablet 3  . traZODone (DESYREL) 150 MG tablet Take 1 tablet (150 mg total) by mouth at bedtime. 90 tablet 0  . venlafaxine XR (EFFEXOR XR) 75 MG 24 hr capsule Take 3 capsules in the morning 270 capsule 0   No current facility-administered medications for this visit.     Musculoskeletal: Strength & Muscle Tone: unable to assess due to telemed visit Gait & Station: unable to assess due to  telemed visit Patient leans: unable to assess due to telemed visit  Psychiatric Specialty Exam: Review of Systems  There were no vitals taken for this visit.There is no height or weight on file to calculate BMI.  General Appearance: Fairly Groomed  Eye Contact:  Good  Speech:  Clear and Coherent and Normal Rate  Volume:  Normal  Mood: Depressed, tearful  Affect:  Congruent  Thought Process:  Goal Directed, Linear and Descriptions of Associations: Intact  Orientation:  Full (Time, Place, and Person)  Thought Content: Logical   Suicidal Thoughts:  No  Homicidal Thoughts:  No  Memory:  Recent;   Good Remote;   Good  Judgement:  Fair  Insight:  Fair  Psychomotor Activity:  Normal  Concentration:  Concentration: Good and Attention Span: Good  Recall:  Good  Fund of Knowledge: Good  Language: Good  Akathisia:  Negative  Handed:  Right  AIMS (if indicated): not done  Assets:  Communication Skills Desire for Improvement Financial Resources/Insurance Housing  ADL's:  Intact  Cognition: WNL  Sleep:  Fair     Assessment and Plan: Patient is continuing to worry and dealing with anticipatory grieving in light of her husband's grave prognosis.  Her husband is currently in hospice care and she has been his primary care provider.  She worries about the worst outcomes.    1. MDD (major depressive disorder), recurrent, in partial  remission (HCC)  - brexpiprazole (REXULTI) 1 MG TABS tablet; Take 1 tablet (1 mg total) by mouth daily.  Dispense: 90 tablet; Refill: 0 - venlafaxine XR (EFFEXOR XR) 75 MG 24 hr capsule; Take 3 capsules in the morning  Dispense: 270 capsule; Refill: 0 - traZODone (DESYREL) 150 MG tablet; Take 1 tablet (150 mg total) by mouth at bedtime.  Dispense: 90 tablet; Refill: 0  2. Anxiety  - busPIRone (BUSPAR) 15 MG tablet; Take 1 tablet (15 mg total) by mouth 2 (two) times daily.  Dispense: 180 tablet; Refill: 0 - clonazePAM (KLONOPIN) 0.5 MG tablet; Take 1 tablet (0.5 mg total) by mouth 2 (two) times daily as needed for anxiety.  Dispense: 180 tablet; Refill: 0 - venlafaxine XR (EFFEXOR XR) 75 MG 24 hr capsule; Take 3 capsules in the morning  Dispense: 270 capsule; Refill: 0  3. Anticipatory grief  Continue individual therapy with Ms. Janee Morn.  Recommend to continue the same regimen for now.  Follow up in 6 weeks.   Zena Amos, MD 09/01/2020, 8:46 AM

## 2020-10-11 ENCOUNTER — Telehealth (HOSPITAL_COMMUNITY): Payer: Self-pay

## 2020-10-11 NOTE — Telephone Encounter (Signed)
Patient called and stated that her husband passed away on 10-24-22 and that she's not doing very well. She stated that she can't function and needs something to calm her down.

## 2020-10-12 NOTE — Telephone Encounter (Signed)
I am extremely sorry to hear that. I have an appointment with her scheduled for next week. In the mean time she can take 2 tablets of clonazepam together as needed for the overwhelming anxiety. We will discuss further plan of action at the time of her appt with me on next Monday at 2:20 pm. Remind her of the appointment. Thanks.

## 2020-10-12 NOTE — Telephone Encounter (Signed)
Called patient to relay message to her from dr. Pt didn't pick up phone - LVM

## 2020-10-18 ENCOUNTER — Other Ambulatory Visit: Payer: Self-pay

## 2020-10-18 ENCOUNTER — Encounter (HOSPITAL_COMMUNITY): Payer: Self-pay | Admitting: Psychiatry

## 2020-10-18 ENCOUNTER — Telehealth (INDEPENDENT_AMBULATORY_CARE_PROVIDER_SITE_OTHER): Payer: 59 | Admitting: Psychiatry

## 2020-10-18 DIAGNOSIS — F419 Anxiety disorder, unspecified: Secondary | ICD-10-CM | POA: Diagnosis not present

## 2020-10-18 DIAGNOSIS — F3341 Major depressive disorder, recurrent, in partial remission: Secondary | ICD-10-CM | POA: Diagnosis not present

## 2020-10-18 DIAGNOSIS — F4321 Adjustment disorder with depressed mood: Secondary | ICD-10-CM | POA: Diagnosis not present

## 2020-10-18 MED ORDER — BREXPIPRAZOLE 1 MG PO TABS: 1.0000 mg | ORAL_TABLET | Freq: Every day | ORAL | 0 refills | Status: DC

## 2020-10-18 MED ORDER — VENLAFAXINE HCL ER 75 MG PO CP24: ORAL_CAPSULE | ORAL | 0 refills | Status: DC

## 2020-10-18 MED ORDER — CLONAZEPAM 0.5 MG PO TABS: 0.5000 mg | ORAL_TABLET | Freq: Two times a day (BID) | ORAL | 0 refills | Status: DC | PRN

## 2020-10-18 MED ORDER — TRAZODONE HCL 150 MG PO TABS
150.0000 mg | ORAL_TABLET | Freq: Every day | ORAL | 0 refills | Status: DC
Start: 2020-10-18 — End: 2021-08-09

## 2020-10-18 MED ORDER — BUSPIRONE HCL 15 MG PO TABS: 15.0000 mg | ORAL_TABLET | Freq: Two times a day (BID) | ORAL | 0 refills | Status: AC

## 2020-10-18 NOTE — Progress Notes (Signed)
BH MD OP Progress Note  Virtual Visit via Video Note  I connected with Carol Hensley on 10/18/20 at  2:20 PM EDT by a video enabled telemedicine application and verified that I am speaking with the correct person using two identifiers.  Location: Patient: Home Provider: Clinic   I discussed the limitations of evaluation and management by telemedicine and the availability of in person appointments. The patient expressed understanding and agreed to proceed.  I provided 17 minutes of non-face-to-face time during this encounter.     10/18/2020 4:24 PM Carol Hensley  MRN:  [id number]  Chief Complaint:  " I am not doing well."  HPI: Patient lost her husband on April 16.  Patient informed that she has not been doing well since then.  She is constantly crying and thinking about her husband.  She does not sleep well and has poor appetite.  Her daughter who also has a full-time job and is caregiver for her elderly grandmother has been her main support through this.  She informed that she stays with her daughter over the weekends and does not spend much time alone. She had contacted the office to report of uncontrollable crying and writer had advised her to try taking extra dose of clonazepam to see if that would help and today patient reported that that did not really help much.  Writer advised the patient that since she is undergoing grief process it will take a few days for her to get back to her regular self and in the meantime she should rely on talking to her therapist Ms. Janee Morn for other ways to distract herself. Writer provided the patient with much supportive therapy and some grief counseling.   Visit Diagnosis:    ICD-10-CM   1. MDD (major depressive disorder), recurrent, in partial remission (HCC)  F33.41   2. Grief  F43.21   3. Anxiety  F41.9     Past Psychiatric History: MDD, anxiety  Past Medical History:  Past Medical History:  Diagnosis Date  . Agoraphobia  with panic disorder   . Generalized anxiety disorder   . Grief   . History of posttraumatic stress disorder (PTSD)   . Insomnia   . Major depressive disorder, recurrent episode, moderate (HCC)   . Persistent insomnia   . Posttraumatic stress disorder     Past Surgical History:  Procedure Laterality Date  . ABDOMINAL HYSTERECTOMY    . APPENDECTOMY    . COLONOSCOPY WITH PROPOFOL N/A 02/24/2019   Procedure: COLONOSCOPY WITH PROPOFOL;  Surgeon: Christena Deem, MD;  Location: [facility] ENDOSCOPY;  Service: Endoscopy;  Laterality: N/A;  . KIDNEY SURGERY      Family Psychiatric History: see below  Family History:  Family History  Problem Relation Age of Onset  . Depression Father   . Alcohol abuse Maternal Grandmother   . Alcohol abuse Paternal Grandmother   . Depression Paternal Grandmother     Social History:  Social History   Socioeconomic History  . Marital status: Married    Spouse name: Chrissie Noa  . Number of children: 1  . Years of education: Not on file  . Highest education level: High school graduate  Occupational History  . Not on file  Tobacco Use  . Smoking status: Former Games developer  . Smokeless tobacco: Never Used  Vaping Use  . Vaping Use: Never used  Substance and Sexual Activity  . Alcohol use: No  . Drug use: No  . Sexual activity: Yes  Partners: Male    Birth control/protection: None  Other Topics Concern  . Not on file  Social History Narrative  . Not on file   Social Determinants of Health   Financial Resource Strain: Not on file  Food Insecurity: Not on file  Transportation Needs: Not on file  Physical Activity: Not on file  Stress: Not on file  Social Connections: Not on file    Allergies:  Allergies  Allergen Reactions  . Ciprofloxacin Nausea And Vomiting and Hives  . Hydrocodone-Acetaminophen Hives, Nausea And Vomiting and Rash  . Sulfa Antibiotics Rash  . Zolpidem Other (See Comments)    Getting up in the middle of night sleep  walking , trying to use appliances , knives etc.  . Zolpidem Tartrate Other (See Comments)    Getting up in the middle of night sleep walking , trying to use appliances , knives etc.  . Compazine  [Prochlorperazine]   . Elemental Sulfur   . Prochlorperazine Maleate     Muscle spasm  . Sulfamethoxazole   . Vancomycin Other (See Comments)    Nausea, fatigue, mouth sores.    Metabolic Disorder Labs: No results found for: HGBA1C, MPG No results found for: PROLACTIN No results found for: CHOL, TRIG, HDL, CHOLHDL, VLDL, LDLCALC No results found for: TSH  Therapeutic Level Labs: No results found for: LITHIUM No results found for: VALPROATE No components found for:  CBMZ  Current Medications: Current Outpatient Medications  Medication Sig Dispense Refill  . acyclovir ointment (ZOVIRAX) 5 % APPLY 1 GRAM TOPICALLY EVERY 3 HOURS AS NEEDED    . brexpiprazole (REXULTI) 1 MG TABS tablet Take 1 tablet (1 mg total) by mouth daily. 90 tablet 0  . busPIRone (BUSPAR) 15 MG tablet Take 1 tablet (15 mg total) by mouth 2 (two) times daily. 180 tablet 0  . cholestyramine (QUESTRAN) 4 g packet Take by mouth.    . clonazePAM (KLONOPIN) 0.5 MG tablet Take 1 tablet (0.5 mg total) by mouth 2 (two) times daily as needed for anxiety. 180 tablet 0  . estradiol (ESTRACE) 0.5 MG tablet Take 0.5 mg by mouth daily.    . Estradiol 10 MCG TABS vaginal tablet Place 1 tablet vaginally 2 (two) times a week.    . estrogens, conjugated, (PREMARIN) 1.25 MG tablet Take by mouth.    . itraconazole (SPORANOX) 100 MG capsule Take by mouth.    . lipase/protease/amylase (CREON) 36000 UNITS CPEP capsule 2 CAPS DURING EACH MEAL, AND 1 CAP DURING [facility] SNACK. TAKE DURING EVERY MEAL & SNACK.    Marland Kitchen potassium citrate (UROCIT-K) 10 MEQ (1080 MG) SR tablet Take 2 tablets (20 mEq total) by mouth 2 (two) times daily. 360 tablet 3  . traZODone (DESYREL) 150 MG tablet Take 1 tablet (150 mg total) by mouth at bedtime. 90 tablet 0  .  venlafaxine XR (EFFEXOR XR) 75 MG 24 hr capsule Take 3 capsules in the morning 270 capsule 0   No current facility-administered medications for this visit.     Musculoskeletal: Strength & Muscle Tone: unable to assess due to telemed visit Gait & Station: unable to assess due to telemed visit Patient leans: unable to assess due to telemed visit  Psychiatric Specialty Exam: Review of Systems  There were no vitals taken for this visit.There is no height or weight on file to calculate BMI.  General Appearance: Fairly Groomed  Eye Contact:  Good  Speech:  Clear and Coherent and Normal Rate  Volume:  Normal  Mood: Depressed, tearful  Affect:  Congruent  Thought Process:  Goal Directed, Linear and Descriptions of Associations: Intact  Orientation:  Full (Time, Place, and Person)  Thought Content: Logical   Suicidal Thoughts:  No  Homicidal Thoughts:  No  Memory:  Recent;   Good Remote;   Good  Judgement:  Fair  Insight:  Fair  Psychomotor Activity:  Normal  Concentration:  Concentration: Good and Attention Span: Good  Recall:  Good  Fund of Knowledge: Good  Language: Good  Akathisia:  Negative  Handed:  Right  AIMS (if indicated): not done  Assets:  Communication Skills Desire for Improvement Financial Resources/Insurance Housing  ADL's:  Intact  Cognition: WNL  Sleep:  Fair     Assessment and Plan: Patient unfortunately lost her husband on April 16 and is grieving his loss currently.  Writer provided her with reassurance and advised her to continue same regimen for now.    1. MDD (major depressive disorder), recurrent, in partial remission (HCC)  - brexpiprazole (REXULTI) 1 MG TABS tablet; Take 1 tablet (1 mg total) by mouth daily.  Dispense: 90 tablet; Refill: 0 - traZODone (DESYREL) 150 MG tablet; Take 1 tablet (150 mg total) by mouth at bedtime.  Dispense: 90 tablet; Refill: 0 - venlafaxine XR (EFFEXOR XR) 75 MG 24 hr capsule; Take 3 capsules in the morning   Dispense: 270 capsule; Refill: 0  2. Grief   3. Anxiety  - clonazePAM (KLONOPIN) 0.5 MG tablet; Take 1 tablet (0.5 mg total) by mouth 2 (two) times daily as needed for anxiety.  Dispense: 180 tablet; Refill: 0 - venlafaxine XR (EFFEXOR XR) 75 MG 24 hr capsule; Take 3 capsules in the morning  Dispense: 270 capsule; Refill: 0 - busPIRone (BUSPAR) 15 MG tablet; Take 1 tablet (15 mg total) by mouth 2 (two) times daily.  Dispense: 180 tablet; Refill: 0   Continue individual therapy with Ms. Janee Morn. Recommend to continue the same regimen for now. Follow up in 8 weeks. Provider informed the patient that her care is being transferred to a different psychiatrist at [facility] psychiat[facility].  Patient verbalized her understanding.   Zena Amos, MD 10/18/2020, 4:24 PM
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
## 2020-12-16 ENCOUNTER — Telehealth: Payer: 59 | Admitting: Psychiatry

## 2020-12-16 ENCOUNTER — Telehealth: Payer: Self-pay | Admitting: Psychiatry

## 2020-12-16 ENCOUNTER — Other Ambulatory Visit: Payer: Self-pay

## 2020-12-16 NOTE — Telephone Encounter (Signed)
Sent link for video visit through Epic. Patient did not sign in. Called the patient for appointment scheduled today. She states that she has not received the link. When this clinician asked to check the text message as it will be sent again, she states that she is cancelling the appointment. She states that she called the office and cancelled the appointment. She states that she does not want ot have follow up appointment.

## 2021-01-21 IMAGING — CR ABDOMEN - 2 VIEW
2 series · 2 of 2 positions shown · non-contrast
Comparison: CT abdomen and pelvis 02/25/2013.

CLINICAL DATA: Left upper quadrant pain for 3 or 4 weeks with
nausea. History of hepatitis C.

EXAM:
ABDOMEN - 2 VIEW

[abdomen erect]
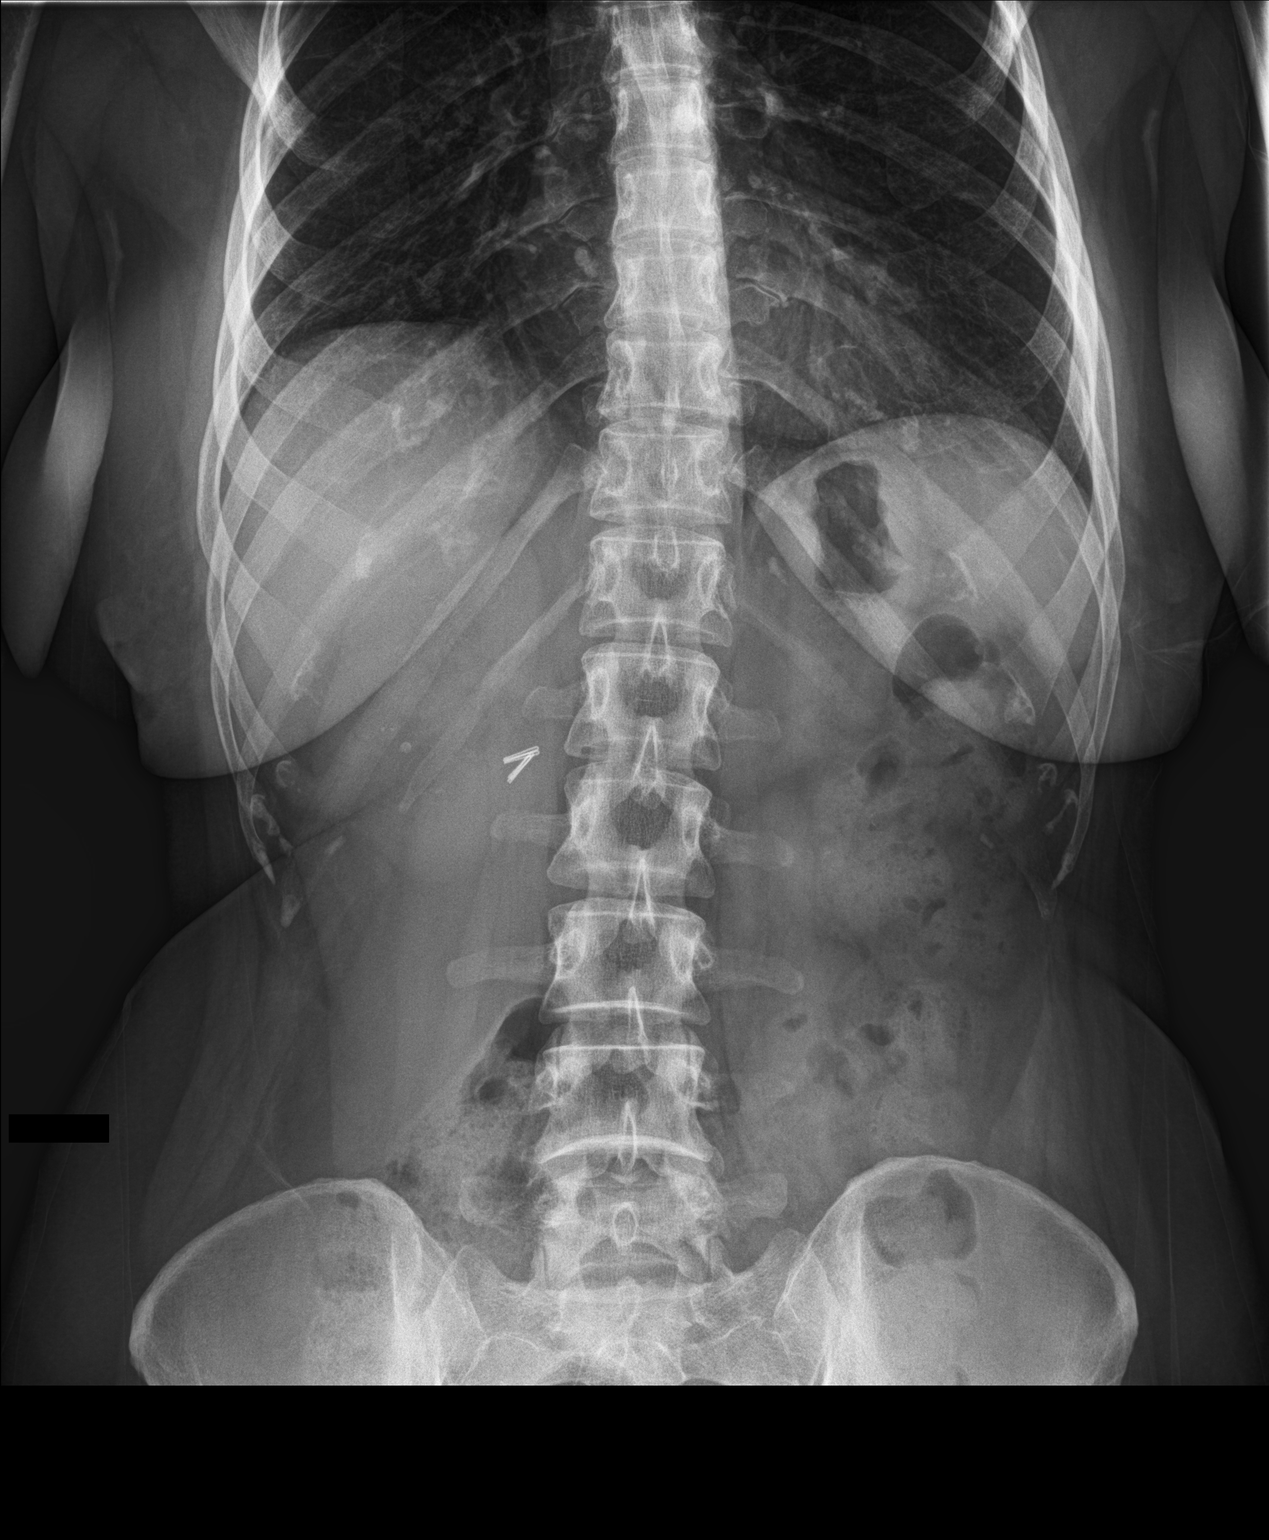

[abdomen supine]
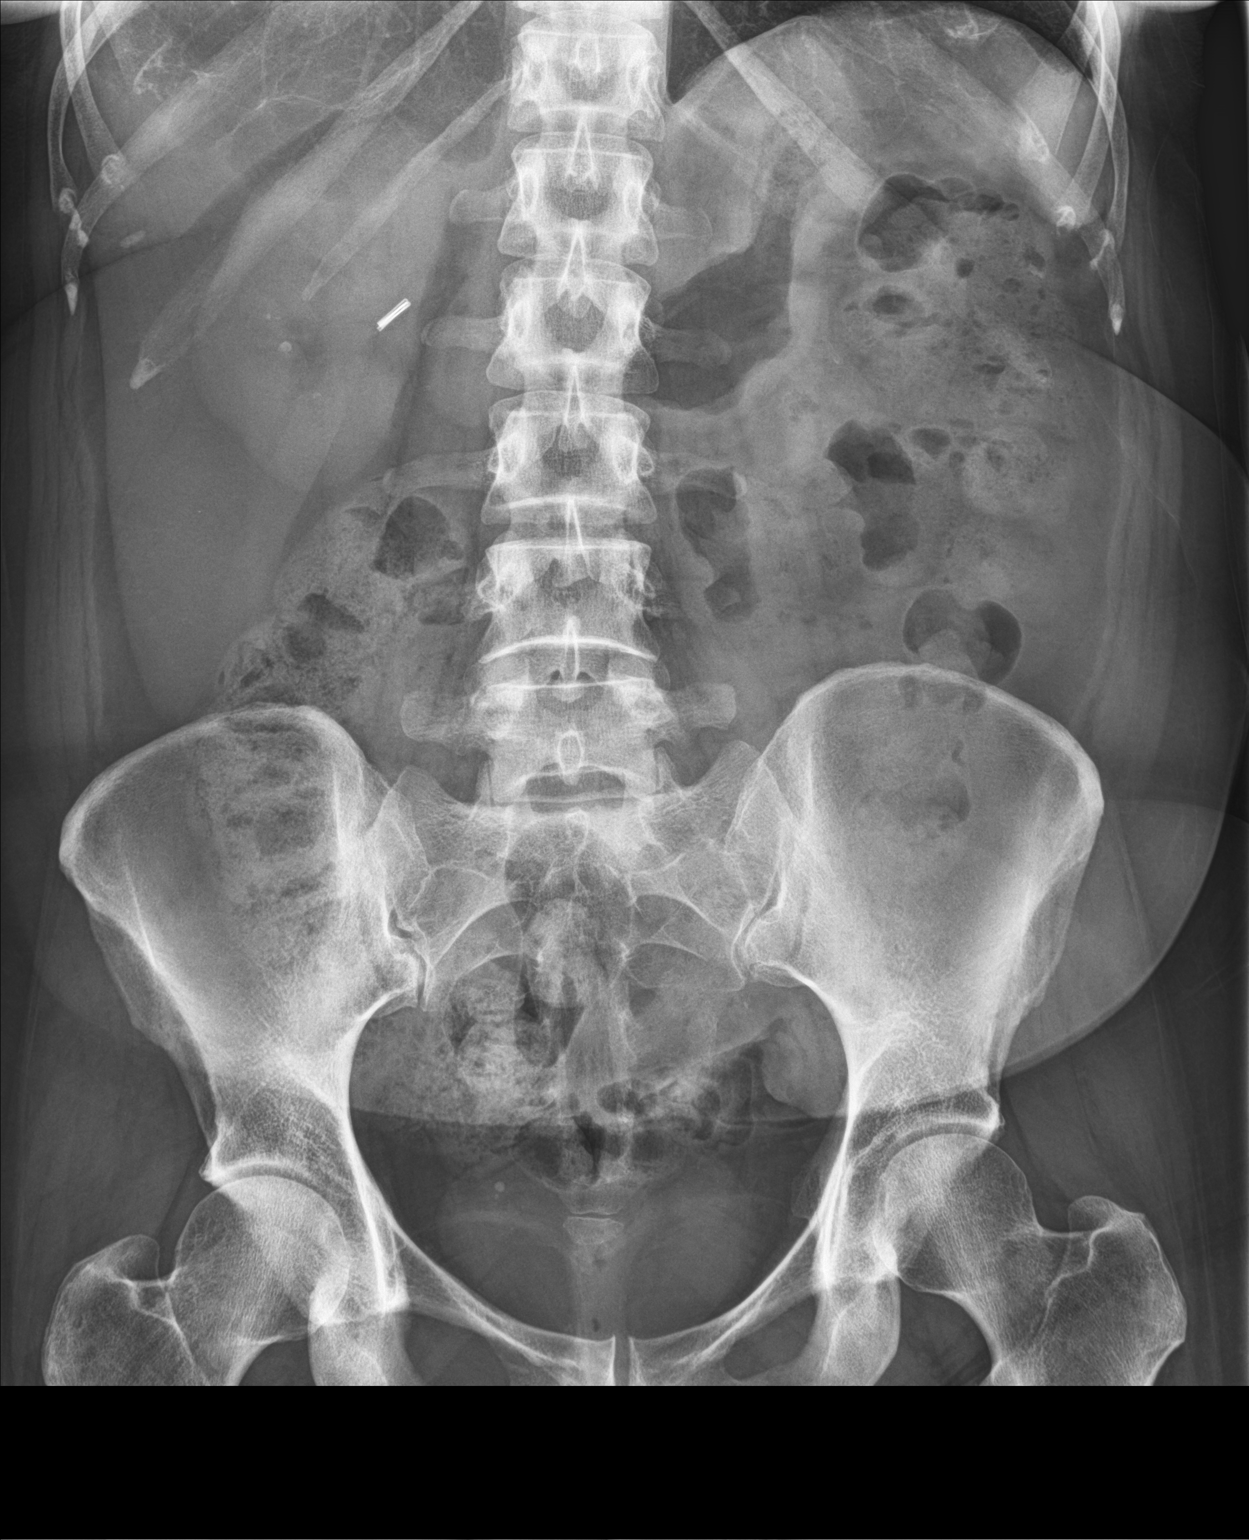

[2 of 2 positions shown; findings below may reference images not displayed]

FINDINGS: The bowel gas pattern is normal. There is no evidence of free air.
Punctate nonobstructing right renal stones are identified. There may
be stones in the left kidney which are obscured by gas and stool.
IMPRESSION: No acute finding.

Punctate nonobstructing right renal stones. There are likely left
renal stones which are obscured by gas and stool.

## 2021-01-25 ENCOUNTER — Other Ambulatory Visit (HOSPITAL_COMMUNITY): Payer: Self-pay | Admitting: Psychiatry

## 2021-01-25 DIAGNOSIS — F3341 Major depressive disorder, recurrent, in partial remission: Secondary | ICD-10-CM

## 2021-02-24 ENCOUNTER — Other Ambulatory Visit (HOSPITAL_COMMUNITY): Payer: Self-pay | Admitting: Psychiatry

## 2021-02-24 DIAGNOSIS — F3341 Major depressive disorder, recurrent, in partial remission: Secondary | ICD-10-CM

## 2021-04-04 NOTE — Telephone Encounter (Signed)
She declined to have any follow up with this clinician

## 2021-06-14 ENCOUNTER — Other Ambulatory Visit: Payer: Self-pay | Admitting: Urology

## 2021-06-14 DIAGNOSIS — N200 Calculus of kidney: Secondary | ICD-10-CM

## 2021-06-14 DIAGNOSIS — E7209 Other disorders of amino-acid transport: Secondary | ICD-10-CM

## 2021-07-05 ENCOUNTER — Other Ambulatory Visit: Payer: Self-pay

## 2021-07-05 DIAGNOSIS — E7209 Other disorders of amino-acid transport: Secondary | ICD-10-CM

## 2021-07-05 DIAGNOSIS — N200 Calculus of kidney: Secondary | ICD-10-CM

## 2021-07-05 MED ORDER — POTASSIUM CITRATE ER 10 MEQ (1080 MG) PO TBCR: 20.0000 meq | EXTENDED_RELEASE_TABLET | Freq: Two times a day (BID) | ORAL | 3 refills | Status: AC

## 2021-07-05 NOTE — Telephone Encounter (Signed)
Pt has changed pharmacy. RX sent.

## 2021-08-09 ENCOUNTER — Ambulatory Visit
Admission: RE | Admit: 2021-08-09 | Discharge: 2021-08-09 | Disposition: A | Discharge status: Home / Self Care | Payer: 59 | Attending: Urology | Admitting: Urology

## 2021-08-09 ENCOUNTER — Other Ambulatory Visit: Payer: Self-pay | Admitting: *Deleted

## 2021-08-09 ENCOUNTER — Encounter: Payer: Self-pay | Admitting: Urology

## 2021-08-09 ENCOUNTER — Ambulatory Visit
Admission: RE | Admit: 2021-08-09 | Discharge: 2021-08-09 | Disposition: A | Discharge status: Home / Self Care | Payer: 59 | Source: Ambulatory Visit | Attending: Urology | Admitting: Urology

## 2021-08-09 ENCOUNTER — Ambulatory Visit (INDEPENDENT_AMBULATORY_CARE_PROVIDER_SITE_OTHER): Payer: 59 | Admitting: Urology

## 2021-08-09 ENCOUNTER — Other Ambulatory Visit
Admission: RE | Admit: 2021-08-09 | Discharge: 2021-08-09 | Disposition: A | Discharge status: Home / Self Care | Payer: 59 | Source: Home / Self Care | Attending: Urology | Admitting: Urology

## 2021-08-09 ENCOUNTER — Other Ambulatory Visit: Payer: Self-pay

## 2021-08-09 VITALS — BP 125/75 | HR 90 | Ht 59.5 in | Wt 148.0 lb

## 2021-08-09 DIAGNOSIS — N200 Calculus of kidney: Secondary | ICD-10-CM

## 2021-08-09 LAB — URINALYSIS, COMPLETE (UACMP) WITH MICROSCOPIC
Bilirubin Urine: NEGATIVE
Glucose, UA: NEGATIVE mg/dL
Hgb urine dipstick: NEGATIVE
Ketones, ur: NEGATIVE mg/dL
Nitrite: NEGATIVE
Protein, ur: NEGATIVE mg/dL
Specific Gravity, Urine: 1.015 (ref 1.005–1.030)
Squamous Epithelial / HPF: >50 (ref 0–5)
pH: 7.5 (ref 5.0–8.0)

## 2021-08-09 NOTE — Progress Notes (Signed)
° °  08/09/2021 1:11 PM   Carol Hensley [birth year] [id number]  Reason for visit: Follow up cystine nephrolithiasis  HPI: I saw [name] back for routine follow-up of cystine nephrolithiasis.  Her prior urologist was at [facility], and those records are not available to me.  She has had stones since she was a teenager, and has had over 10 surgeries for kidney stones.  She has been managed very well over the last ~5 years on 20 mEq potassium citrate twice daily and has not had any stone episodes.  She denies any problems over the last year and is doing well.  Urinalysis today contaminated with skin cells, but pH good at 7.5 indicating compliance with alkalinization therapy.  I personally reviewed her KUB today that shows a stable 3 mm right midpole stone unchanged from prior.  We reviewed stone prevention strategies in the setting of her recurrent cystine stones including high fluid volume, low-salt diet, avoiding high protein diet, and compliance with potassium citrate.  She continues to do an excellent job and has not had stones in over 5 years.  Potassium citrate 20 mEq twice daily refilled RTC 1 year with KUB prior If doing well at that time could consider spacing to every 2 years, or having PCP fill potassium citrate and see urology as needed  Sondra Come, MD  [facility] [address] [place], [place] [address] [phone]

## 2022-06-30 DIAGNOSIS — F331 Major depressive disorder, recurrent, moderate: Secondary | ICD-10-CM | POA: Diagnosis not present

## 2022-06-30 DIAGNOSIS — F4312 Post-traumatic stress disorder, chronic: Secondary | ICD-10-CM | POA: Diagnosis not present

## 2022-06-30 DIAGNOSIS — F410 Panic disorder [episodic paroxysmal anxiety] without agoraphobia: Secondary | ICD-10-CM | POA: Diagnosis not present

## 2022-07-12 DIAGNOSIS — F331 Major depressive disorder, recurrent, moderate: Secondary | ICD-10-CM | POA: Diagnosis not present

## 2022-07-12 DIAGNOSIS — F4312 Post-traumatic stress disorder, chronic: Secondary | ICD-10-CM | POA: Diagnosis not present

## 2022-07-12 DIAGNOSIS — F410 Panic disorder [episodic paroxysmal anxiety] without agoraphobia: Secondary | ICD-10-CM | POA: Diagnosis not present

## 2022-07-24 DIAGNOSIS — F410 Panic disorder [episodic paroxysmal anxiety] without agoraphobia: Secondary | ICD-10-CM | POA: Diagnosis not present

## 2022-07-24 DIAGNOSIS — F5105 Insomnia due to other mental disorder: Secondary | ICD-10-CM | POA: Diagnosis not present

## 2022-07-24 DIAGNOSIS — F331 Major depressive disorder, recurrent, moderate: Secondary | ICD-10-CM | POA: Diagnosis not present

## 2022-07-24 DIAGNOSIS — F4312 Post-traumatic stress disorder, chronic: Secondary | ICD-10-CM | POA: Diagnosis not present

## 2022-07-27 DIAGNOSIS — F331 Major depressive disorder, recurrent, moderate: Secondary | ICD-10-CM | POA: Diagnosis not present

## 2022-07-27 DIAGNOSIS — F4312 Post-traumatic stress disorder, chronic: Secondary | ICD-10-CM | POA: Diagnosis not present

## 2022-07-27 DIAGNOSIS — F410 Panic disorder [episodic paroxysmal anxiety] without agoraphobia: Secondary | ICD-10-CM | POA: Diagnosis not present

## 2022-08-01 DIAGNOSIS — N951 Menopausal and female climacteric states: Secondary | ICD-10-CM | POA: Diagnosis not present

## 2022-08-01 DIAGNOSIS — R232 Flushing: Secondary | ICD-10-CM | POA: Diagnosis not present

## 2022-08-01 DIAGNOSIS — Z01419 Encounter for gynecological examination (general) (routine) without abnormal findings: Secondary | ICD-10-CM | POA: Diagnosis not present

## 2022-08-01 DIAGNOSIS — Z6829 Body mass index (BMI) 29.0-29.9, adult: Secondary | ICD-10-CM | POA: Diagnosis not present

## 2022-08-05 IMAGING — CR DG ABDOMEN 1V
2 series · 2 of 2 positions shown · non-contrast
Comparison: Abdominal radiograph 01/20/2019

CLINICAL DATA: Kidney stones.  Left flank pain for 1 month.

EXAM:
ABDOMEN - 1 VIEW

[abdomen kub (1 of 2)]
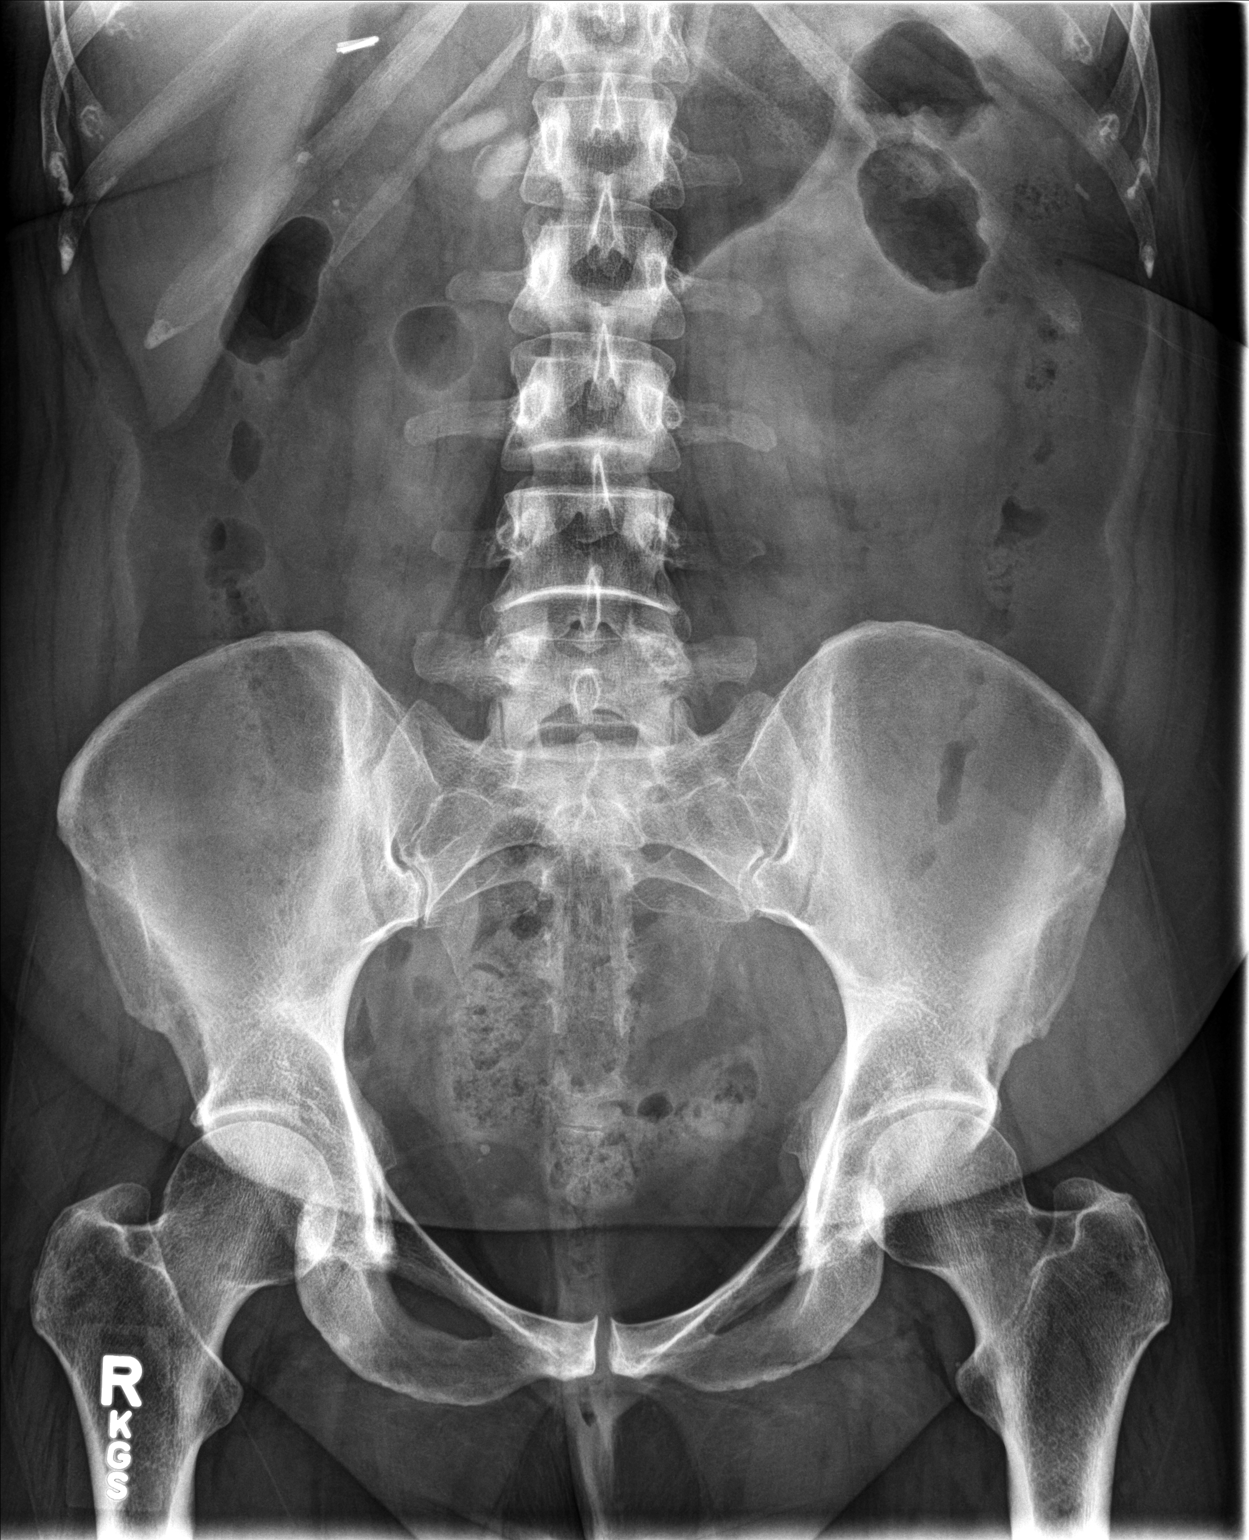

[abdomen kub (2 of 2)]
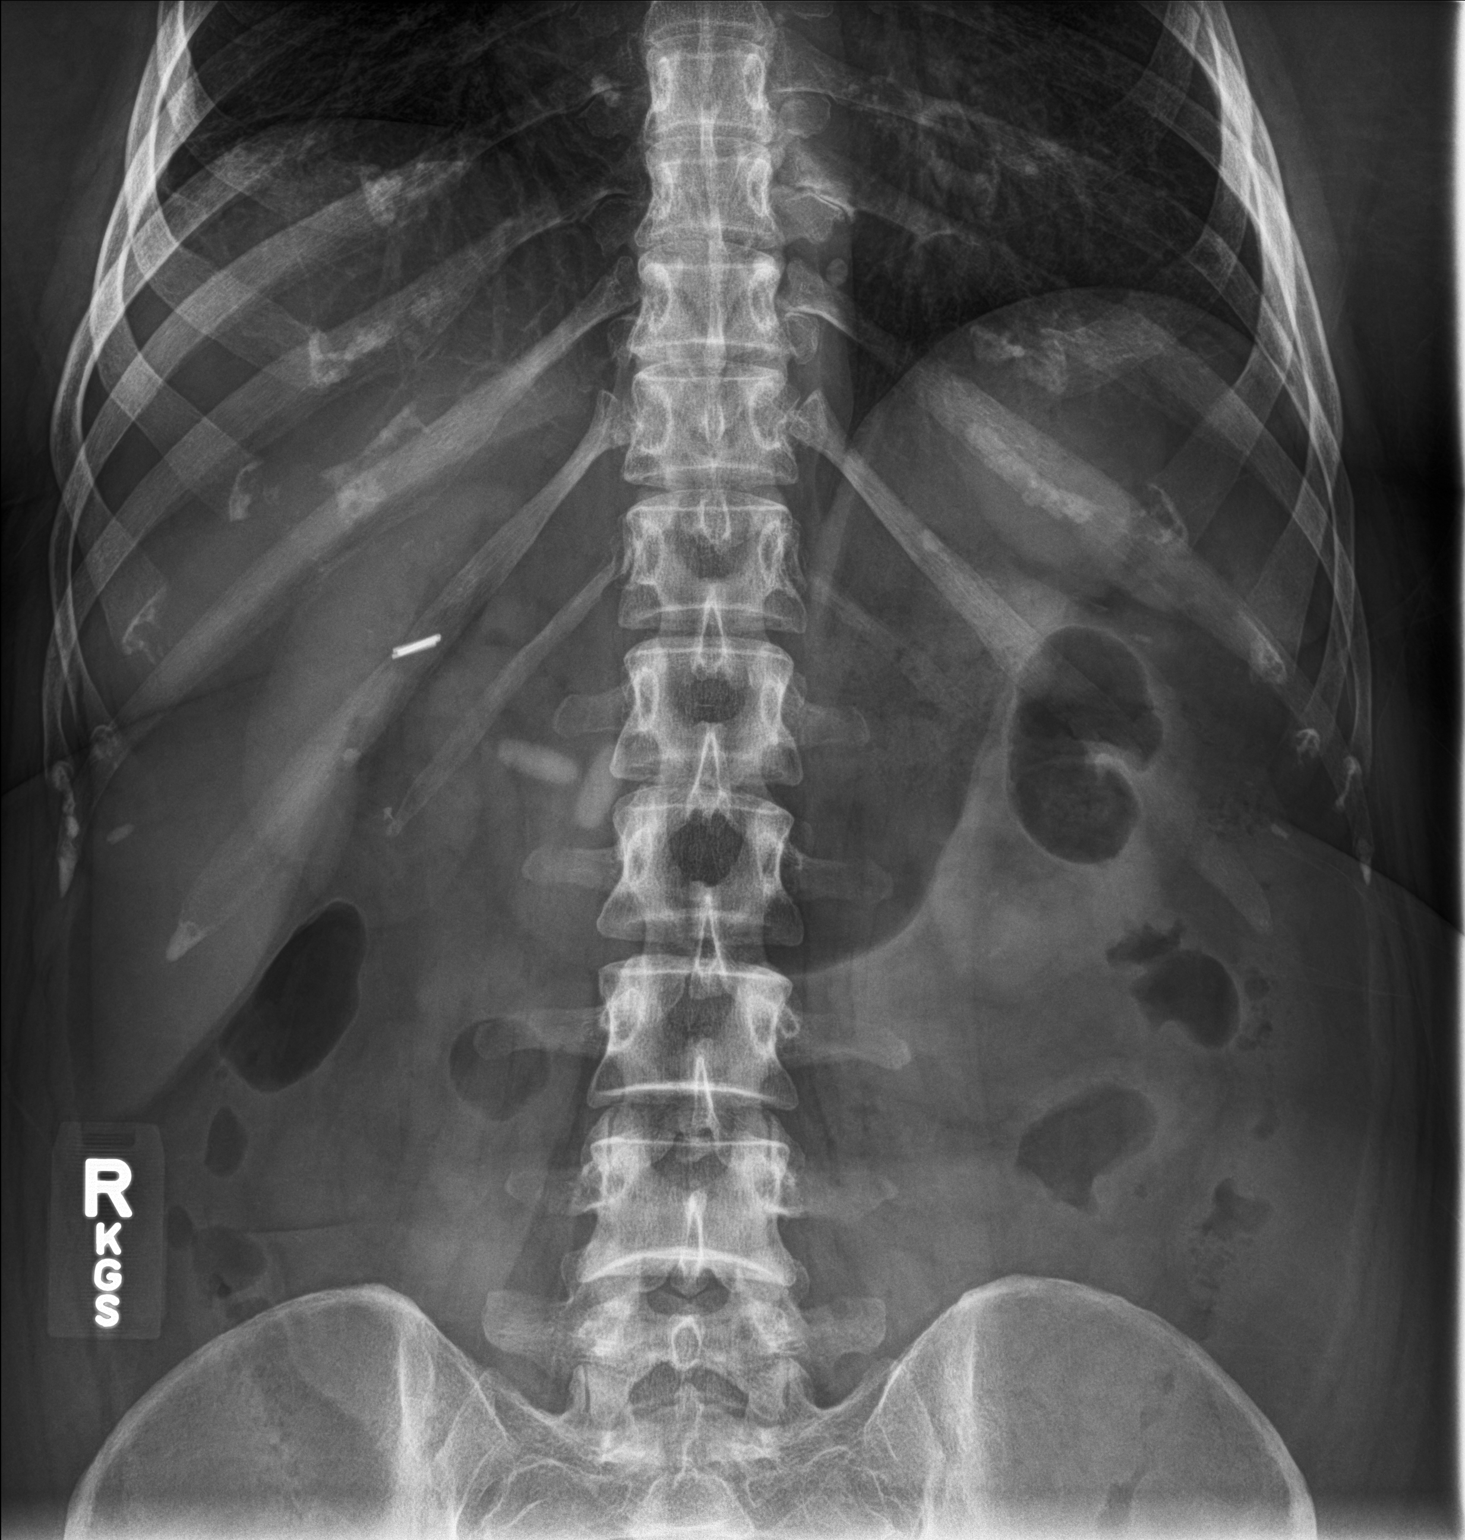

[2 of 2 positions shown; findings below may reference images not displayed]

FINDINGS: Two calcifications consistent stones project over the lower right
kidney, larger measuring 5 mm. No definite left renal calculi.
Portions of the left kidney are obscured by overlying bowel gas. No
stones project over the course of the ureters. Right pelvic
calcification is unchanged from prior exam and consistent with
phleboliths. Cholecystectomy clips in the right upper quadrant.
Normal bowel gas pattern with small volume of colonic stool. Two
ingested pills project over the upper abdomen, possibly in the
stomach. The lung bases are clear. No osseous abnormalities are
seen.
IMPRESSION: 1. Two right renal stones, larger measuring 5 mm.
2. No visualized left-sided urolithiasis.

## 2022-08-08 ENCOUNTER — Ambulatory Visit: Payer: 59 | Admitting: Urology

## 2022-08-10 DIAGNOSIS — F331 Major depressive disorder, recurrent, moderate: Secondary | ICD-10-CM | POA: Diagnosis not present

## 2022-08-10 DIAGNOSIS — F410 Panic disorder [episodic paroxysmal anxiety] without agoraphobia: Secondary | ICD-10-CM | POA: Diagnosis not present

## 2022-08-10 DIAGNOSIS — F4312 Post-traumatic stress disorder, chronic: Secondary | ICD-10-CM | POA: Diagnosis not present

## 2022-08-21 DIAGNOSIS — U071 COVID-19: Secondary | ICD-10-CM | POA: Diagnosis not present

## 2022-09-04 DIAGNOSIS — F331 Major depressive disorder, recurrent, moderate: Secondary | ICD-10-CM | POA: Diagnosis not present

## 2022-09-04 DIAGNOSIS — F4312 Post-traumatic stress disorder, chronic: Secondary | ICD-10-CM | POA: Diagnosis not present

## 2022-09-04 DIAGNOSIS — F5105 Insomnia due to other mental disorder: Secondary | ICD-10-CM | POA: Diagnosis not present

## 2022-09-04 DIAGNOSIS — F410 Panic disorder [episodic paroxysmal anxiety] without agoraphobia: Secondary | ICD-10-CM | POA: Diagnosis not present

## 2022-09-07 DIAGNOSIS — F331 Major depressive disorder, recurrent, moderate: Secondary | ICD-10-CM | POA: Diagnosis not present

## 2022-09-07 DIAGNOSIS — F4312 Post-traumatic stress disorder, chronic: Secondary | ICD-10-CM | POA: Diagnosis not present

## 2022-09-07 DIAGNOSIS — F410 Panic disorder [episodic paroxysmal anxiety] without agoraphobia: Secondary | ICD-10-CM | POA: Diagnosis not present

## 2022-09-21 DIAGNOSIS — F331 Major depressive disorder, recurrent, moderate: Secondary | ICD-10-CM | POA: Diagnosis not present

## 2022-09-21 DIAGNOSIS — F410 Panic disorder [episodic paroxysmal anxiety] without agoraphobia: Secondary | ICD-10-CM | POA: Diagnosis not present

## 2022-09-21 DIAGNOSIS — F4312 Post-traumatic stress disorder, chronic: Secondary | ICD-10-CM | POA: Diagnosis not present

## 2022-10-05 DIAGNOSIS — F4312 Post-traumatic stress disorder, chronic: Secondary | ICD-10-CM | POA: Diagnosis not present

## 2022-10-05 DIAGNOSIS — F331 Major depressive disorder, recurrent, moderate: Secondary | ICD-10-CM | POA: Diagnosis not present

## 2022-10-05 DIAGNOSIS — F410 Panic disorder [episodic paroxysmal anxiety] without agoraphobia: Secondary | ICD-10-CM | POA: Diagnosis not present

## 2022-10-19 DIAGNOSIS — F4312 Post-traumatic stress disorder, chronic: Secondary | ICD-10-CM | POA: Diagnosis not present

## 2022-10-19 DIAGNOSIS — F331 Major depressive disorder, recurrent, moderate: Secondary | ICD-10-CM | POA: Diagnosis not present

## 2022-10-19 DIAGNOSIS — F410 Panic disorder [episodic paroxysmal anxiety] without agoraphobia: Secondary | ICD-10-CM | POA: Diagnosis not present

## 2022-11-01 DIAGNOSIS — F331 Major depressive disorder, recurrent, moderate: Secondary | ICD-10-CM | POA: Diagnosis not present

## 2022-11-01 DIAGNOSIS — F4312 Post-traumatic stress disorder, chronic: Secondary | ICD-10-CM | POA: Diagnosis not present

## 2022-11-01 DIAGNOSIS — F410 Panic disorder [episodic paroxysmal anxiety] without agoraphobia: Secondary | ICD-10-CM | POA: Diagnosis not present

## 2022-11-16 DIAGNOSIS — F4312 Post-traumatic stress disorder, chronic: Secondary | ICD-10-CM | POA: Diagnosis not present

## 2022-11-16 DIAGNOSIS — F331 Major depressive disorder, recurrent, moderate: Secondary | ICD-10-CM | POA: Diagnosis not present

## 2022-11-16 DIAGNOSIS — F410 Panic disorder [episodic paroxysmal anxiety] without agoraphobia: Secondary | ICD-10-CM | POA: Diagnosis not present

## 2022-12-01 DIAGNOSIS — F410 Panic disorder [episodic paroxysmal anxiety] without agoraphobia: Secondary | ICD-10-CM | POA: Diagnosis not present

## 2022-12-01 DIAGNOSIS — F4312 Post-traumatic stress disorder, chronic: Secondary | ICD-10-CM | POA: Diagnosis not present

## 2022-12-01 DIAGNOSIS — F331 Major depressive disorder, recurrent, moderate: Secondary | ICD-10-CM | POA: Diagnosis not present

## 2022-12-14 DIAGNOSIS — F410 Panic disorder [episodic paroxysmal anxiety] without agoraphobia: Secondary | ICD-10-CM | POA: Diagnosis not present

## 2022-12-14 DIAGNOSIS — F331 Major depressive disorder, recurrent, moderate: Secondary | ICD-10-CM | POA: Diagnosis not present

## 2022-12-14 DIAGNOSIS — F4312 Post-traumatic stress disorder, chronic: Secondary | ICD-10-CM | POA: Diagnosis not present

## 2023-01-02 DIAGNOSIS — Z6828 Body mass index (BMI) 28.0-28.9, adult: Secondary | ICD-10-CM | POA: Diagnosis not present

## 2023-01-02 DIAGNOSIS — N9089 Other specified noninflammatory disorders of vulva and perineum: Secondary | ICD-10-CM | POA: Diagnosis not present

## 2023-01-02 DIAGNOSIS — L900 Lichen sclerosus et atrophicus: Secondary | ICD-10-CM | POA: Diagnosis not present

## 2023-01-10 DIAGNOSIS — F331 Major depressive disorder, recurrent, moderate: Secondary | ICD-10-CM | POA: Diagnosis not present

## 2023-01-10 DIAGNOSIS — F410 Panic disorder [episodic paroxysmal anxiety] without agoraphobia: Secondary | ICD-10-CM | POA: Diagnosis not present

## 2023-01-10 DIAGNOSIS — F4312 Post-traumatic stress disorder, chronic: Secondary | ICD-10-CM | POA: Diagnosis not present

## 2023-01-15 DIAGNOSIS — N898 Other specified noninflammatory disorders of vagina: Secondary | ICD-10-CM | POA: Diagnosis not present

## 2023-01-24 DIAGNOSIS — F4312 Post-traumatic stress disorder, chronic: Secondary | ICD-10-CM | POA: Diagnosis not present

## 2023-01-24 DIAGNOSIS — F410 Panic disorder [episodic paroxysmal anxiety] without agoraphobia: Secondary | ICD-10-CM | POA: Diagnosis not present

## 2023-01-24 DIAGNOSIS — F331 Major depressive disorder, recurrent, moderate: Secondary | ICD-10-CM | POA: Diagnosis not present

## 2023-01-29 DIAGNOSIS — N952 Postmenopausal atrophic vaginitis: Secondary | ICD-10-CM | POA: Diagnosis not present

## 2023-01-29 DIAGNOSIS — L900 Lichen sclerosus et atrophicus: Secondary | ICD-10-CM | POA: Diagnosis not present

## 2023-02-07 DIAGNOSIS — F331 Major depressive disorder, recurrent, moderate: Secondary | ICD-10-CM | POA: Diagnosis not present

## 2023-02-07 DIAGNOSIS — F4312 Post-traumatic stress disorder, chronic: Secondary | ICD-10-CM | POA: Diagnosis not present

## 2023-02-07 DIAGNOSIS — F410 Panic disorder [episodic paroxysmal anxiety] without agoraphobia: Secondary | ICD-10-CM | POA: Diagnosis not present

## 2023-02-21 DIAGNOSIS — F4312 Post-traumatic stress disorder, chronic: Secondary | ICD-10-CM | POA: Diagnosis not present

## 2023-02-21 DIAGNOSIS — F410 Panic disorder [episodic paroxysmal anxiety] without agoraphobia: Secondary | ICD-10-CM | POA: Diagnosis not present

## 2023-02-21 DIAGNOSIS — F331 Major depressive disorder, recurrent, moderate: Secondary | ICD-10-CM | POA: Diagnosis not present

## 2023-03-01 DIAGNOSIS — L900 Lichen sclerosus et atrophicus: Secondary | ICD-10-CM | POA: Diagnosis not present

## 2023-03-01 DIAGNOSIS — Z6828 Body mass index (BMI) 28.0-28.9, adult: Secondary | ICD-10-CM | POA: Diagnosis not present

## 2023-03-07 DIAGNOSIS — F4312 Post-traumatic stress disorder, chronic: Secondary | ICD-10-CM | POA: Diagnosis not present

## 2023-03-07 DIAGNOSIS — F410 Panic disorder [episodic paroxysmal anxiety] without agoraphobia: Secondary | ICD-10-CM | POA: Diagnosis not present

## 2023-03-07 DIAGNOSIS — F331 Major depressive disorder, recurrent, moderate: Secondary | ICD-10-CM | POA: Diagnosis not present

## 2023-03-21 DIAGNOSIS — F4312 Post-traumatic stress disorder, chronic: Secondary | ICD-10-CM | POA: Diagnosis not present

## 2023-03-21 DIAGNOSIS — F331 Major depressive disorder, recurrent, moderate: Secondary | ICD-10-CM | POA: Diagnosis not present

## 2023-03-21 DIAGNOSIS — F410 Panic disorder [episodic paroxysmal anxiety] without agoraphobia: Secondary | ICD-10-CM | POA: Diagnosis not present

## 2023-03-23 ENCOUNTER — Other Ambulatory Visit: Payer: Self-pay | Admitting: Urology

## 2023-03-23 DIAGNOSIS — E7209 Other disorders of amino-acid transport: Secondary | ICD-10-CM

## 2023-03-23 DIAGNOSIS — N200 Calculus of kidney: Secondary | ICD-10-CM

## 2023-04-04 DIAGNOSIS — F410 Panic disorder [episodic paroxysmal anxiety] without agoraphobia: Secondary | ICD-10-CM | POA: Diagnosis not present

## 2023-04-04 DIAGNOSIS — F331 Major depressive disorder, recurrent, moderate: Secondary | ICD-10-CM | POA: Diagnosis not present

## 2023-04-04 DIAGNOSIS — F4312 Post-traumatic stress disorder, chronic: Secondary | ICD-10-CM | POA: Diagnosis not present

## 2023-04-20 DIAGNOSIS — F4312 Post-traumatic stress disorder, chronic: Secondary | ICD-10-CM | POA: Diagnosis not present

## 2023-04-20 DIAGNOSIS — F331 Major depressive disorder, recurrent, moderate: Secondary | ICD-10-CM | POA: Diagnosis not present

## 2023-04-20 DIAGNOSIS — F410 Panic disorder [episodic paroxysmal anxiety] without agoraphobia: Secondary | ICD-10-CM | POA: Diagnosis not present

## 2023-04-27 DIAGNOSIS — F331 Major depressive disorder, recurrent, moderate: Secondary | ICD-10-CM | POA: Diagnosis not present

## 2023-04-27 DIAGNOSIS — F410 Panic disorder [episodic paroxysmal anxiety] without agoraphobia: Secondary | ICD-10-CM | POA: Diagnosis not present

## 2023-04-27 DIAGNOSIS — F4312 Post-traumatic stress disorder, chronic: Secondary | ICD-10-CM | POA: Diagnosis not present

## 2023-05-18 DIAGNOSIS — F331 Major depressive disorder, recurrent, moderate: Secondary | ICD-10-CM | POA: Diagnosis not present

## 2023-05-18 DIAGNOSIS — F4312 Post-traumatic stress disorder, chronic: Secondary | ICD-10-CM | POA: Diagnosis not present

## 2023-05-18 DIAGNOSIS — F410 Panic disorder [episodic paroxysmal anxiety] without agoraphobia: Secondary | ICD-10-CM | POA: Diagnosis not present

## 2023-06-04 DIAGNOSIS — F4312 Post-traumatic stress disorder, chronic: Secondary | ICD-10-CM | POA: Diagnosis not present

## 2023-06-04 DIAGNOSIS — F331 Major depressive disorder, recurrent, moderate: Secondary | ICD-10-CM | POA: Diagnosis not present

## 2023-06-04 DIAGNOSIS — F410 Panic disorder [episodic paroxysmal anxiety] without agoraphobia: Secondary | ICD-10-CM | POA: Diagnosis not present

## 2023-06-18 DIAGNOSIS — F410 Panic disorder [episodic paroxysmal anxiety] without agoraphobia: Secondary | ICD-10-CM | POA: Diagnosis not present

## 2023-06-18 DIAGNOSIS — F4312 Post-traumatic stress disorder, chronic: Secondary | ICD-10-CM | POA: Diagnosis not present

## 2023-06-18 DIAGNOSIS — F331 Major depressive disorder, recurrent, moderate: Secondary | ICD-10-CM | POA: Diagnosis not present

## 2023-06-29 DIAGNOSIS — F4312 Post-traumatic stress disorder, chronic: Secondary | ICD-10-CM | POA: Diagnosis not present

## 2023-06-29 DIAGNOSIS — F331 Major depressive disorder, recurrent, moderate: Secondary | ICD-10-CM | POA: Diagnosis not present

## 2023-06-29 DIAGNOSIS — F410 Panic disorder [episodic paroxysmal anxiety] without agoraphobia: Secondary | ICD-10-CM | POA: Diagnosis not present

## 2023-07-03 DIAGNOSIS — F5105 Insomnia due to other mental disorder: Secondary | ICD-10-CM | POA: Diagnosis not present

## 2023-07-03 DIAGNOSIS — F331 Major depressive disorder, recurrent, moderate: Secondary | ICD-10-CM | POA: Diagnosis not present

## 2023-07-03 DIAGNOSIS — F410 Panic disorder [episodic paroxysmal anxiety] without agoraphobia: Secondary | ICD-10-CM | POA: Diagnosis not present

## 2023-07-03 DIAGNOSIS — F4312 Post-traumatic stress disorder, chronic: Secondary | ICD-10-CM | POA: Diagnosis not present

## 2023-07-12 DIAGNOSIS — R109 Unspecified abdominal pain: Secondary | ICD-10-CM | POA: Diagnosis not present

## 2023-07-13 DIAGNOSIS — K6389 Other specified diseases of intestine: Secondary | ICD-10-CM | POA: Diagnosis not present

## 2023-07-19 DIAGNOSIS — I10 Essential (primary) hypertension: Secondary | ICD-10-CM | POA: Diagnosis not present

## 2023-07-25 DIAGNOSIS — F4312 Post-traumatic stress disorder, chronic: Secondary | ICD-10-CM | POA: Diagnosis not present

## 2023-07-25 DIAGNOSIS — F331 Major depressive disorder, recurrent, moderate: Secondary | ICD-10-CM | POA: Diagnosis not present

## 2023-07-25 DIAGNOSIS — F410 Panic disorder [episodic paroxysmal anxiety] without agoraphobia: Secondary | ICD-10-CM | POA: Diagnosis not present

## 2023-08-24 DIAGNOSIS — F410 Panic disorder [episodic paroxysmal anxiety] without agoraphobia: Secondary | ICD-10-CM | POA: Diagnosis not present

## 2023-08-24 DIAGNOSIS — F331 Major depressive disorder, recurrent, moderate: Secondary | ICD-10-CM | POA: Diagnosis not present

## 2023-08-24 DIAGNOSIS — F4312 Post-traumatic stress disorder, chronic: Secondary | ICD-10-CM | POA: Diagnosis not present

## 2023-09-03 DIAGNOSIS — F331 Major depressive disorder, recurrent, moderate: Secondary | ICD-10-CM | POA: Diagnosis not present

## 2023-09-03 DIAGNOSIS — F5105 Insomnia due to other mental disorder: Secondary | ICD-10-CM | POA: Diagnosis not present

## 2023-09-03 DIAGNOSIS — F4312 Post-traumatic stress disorder, chronic: Secondary | ICD-10-CM | POA: Diagnosis not present

## 2023-09-03 DIAGNOSIS — F410 Panic disorder [episodic paroxysmal anxiety] without agoraphobia: Secondary | ICD-10-CM | POA: Diagnosis not present

## 2023-09-07 DIAGNOSIS — F410 Panic disorder [episodic paroxysmal anxiety] without agoraphobia: Secondary | ICD-10-CM | POA: Diagnosis not present

## 2023-09-07 DIAGNOSIS — F4312 Post-traumatic stress disorder, chronic: Secondary | ICD-10-CM | POA: Diagnosis not present

## 2023-09-07 DIAGNOSIS — F331 Major depressive disorder, recurrent, moderate: Secondary | ICD-10-CM | POA: Diagnosis not present

## 2023-09-10 DIAGNOSIS — Z1231 Encounter for screening mammogram for malignant neoplasm of breast: Secondary | ICD-10-CM | POA: Diagnosis not present

## 2023-09-21 DIAGNOSIS — F410 Panic disorder [episodic paroxysmal anxiety] without agoraphobia: Secondary | ICD-10-CM | POA: Diagnosis not present

## 2023-09-21 DIAGNOSIS — F331 Major depressive disorder, recurrent, moderate: Secondary | ICD-10-CM | POA: Diagnosis not present

## 2023-09-21 DIAGNOSIS — F4312 Post-traumatic stress disorder, chronic: Secondary | ICD-10-CM | POA: Diagnosis not present

## 2023-10-05 DIAGNOSIS — F410 Panic disorder [episodic paroxysmal anxiety] without agoraphobia: Secondary | ICD-10-CM | POA: Diagnosis not present

## 2023-10-05 DIAGNOSIS — F331 Major depressive disorder, recurrent, moderate: Secondary | ICD-10-CM | POA: Diagnosis not present

## 2023-10-05 DIAGNOSIS — F4312 Post-traumatic stress disorder, chronic: Secondary | ICD-10-CM | POA: Diagnosis not present

## 2023-10-19 DIAGNOSIS — F410 Panic disorder [episodic paroxysmal anxiety] without agoraphobia: Secondary | ICD-10-CM | POA: Diagnosis not present

## 2023-10-19 DIAGNOSIS — F4312 Post-traumatic stress disorder, chronic: Secondary | ICD-10-CM | POA: Diagnosis not present

## 2023-10-19 DIAGNOSIS — F331 Major depressive disorder, recurrent, moderate: Secondary | ICD-10-CM | POA: Diagnosis not present

## 2023-10-25 DIAGNOSIS — F5105 Insomnia due to other mental disorder: Secondary | ICD-10-CM | POA: Diagnosis not present

## 2023-10-25 DIAGNOSIS — Z79899 Other long term (current) drug therapy: Secondary | ICD-10-CM | POA: Diagnosis not present

## 2023-10-25 DIAGNOSIS — F410 Panic disorder [episodic paroxysmal anxiety] without agoraphobia: Secondary | ICD-10-CM | POA: Diagnosis not present

## 2023-10-25 DIAGNOSIS — F331 Major depressive disorder, recurrent, moderate: Secondary | ICD-10-CM | POA: Diagnosis not present

## 2023-10-25 DIAGNOSIS — F4312 Post-traumatic stress disorder, chronic: Secondary | ICD-10-CM | POA: Diagnosis not present

## 2023-11-01 DIAGNOSIS — E039 Hypothyroidism, unspecified: Secondary | ICD-10-CM | POA: Diagnosis not present

## 2023-11-01 DIAGNOSIS — F410 Panic disorder [episodic paroxysmal anxiety] without agoraphobia: Secondary | ICD-10-CM | POA: Diagnosis not present

## 2023-11-01 DIAGNOSIS — F4312 Post-traumatic stress disorder, chronic: Secondary | ICD-10-CM | POA: Diagnosis not present

## 2023-11-01 DIAGNOSIS — F331 Major depressive disorder, recurrent, moderate: Secondary | ICD-10-CM | POA: Diagnosis not present

## 2023-11-16 DIAGNOSIS — F410 Panic disorder [episodic paroxysmal anxiety] without agoraphobia: Secondary | ICD-10-CM | POA: Diagnosis not present

## 2023-11-16 DIAGNOSIS — F331 Major depressive disorder, recurrent, moderate: Secondary | ICD-10-CM | POA: Diagnosis not present

## 2023-11-16 DIAGNOSIS — F4312 Post-traumatic stress disorder, chronic: Secondary | ICD-10-CM | POA: Diagnosis not present

## 2023-11-29 DIAGNOSIS — F4312 Post-traumatic stress disorder, chronic: Secondary | ICD-10-CM | POA: Diagnosis not present

## 2023-11-29 DIAGNOSIS — F331 Major depressive disorder, recurrent, moderate: Secondary | ICD-10-CM | POA: Diagnosis not present

## 2023-11-29 DIAGNOSIS — F410 Panic disorder [episodic paroxysmal anxiety] without agoraphobia: Secondary | ICD-10-CM | POA: Diagnosis not present

## 2023-12-13 DIAGNOSIS — F4312 Post-traumatic stress disorder, chronic: Secondary | ICD-10-CM | POA: Diagnosis not present

## 2023-12-13 DIAGNOSIS — F331 Major depressive disorder, recurrent, moderate: Secondary | ICD-10-CM | POA: Diagnosis not present

## 2023-12-13 DIAGNOSIS — F410 Panic disorder [episodic paroxysmal anxiety] without agoraphobia: Secondary | ICD-10-CM | POA: Diagnosis not present

## 2023-12-17 DIAGNOSIS — H00022 Hordeolum internum right lower eyelid: Secondary | ICD-10-CM | POA: Diagnosis not present

## 2023-12-18 DIAGNOSIS — F410 Panic disorder [episodic paroxysmal anxiety] without agoraphobia: Secondary | ICD-10-CM | POA: Diagnosis not present

## 2023-12-18 DIAGNOSIS — F331 Major depressive disorder, recurrent, moderate: Secondary | ICD-10-CM | POA: Diagnosis not present

## 2023-12-18 DIAGNOSIS — F4312 Post-traumatic stress disorder, chronic: Secondary | ICD-10-CM | POA: Diagnosis not present

## 2023-12-18 DIAGNOSIS — F5105 Insomnia due to other mental disorder: Secondary | ICD-10-CM | POA: Diagnosis not present

## 2023-12-27 DIAGNOSIS — F410 Panic disorder [episodic paroxysmal anxiety] without agoraphobia: Secondary | ICD-10-CM | POA: Diagnosis not present

## 2023-12-27 DIAGNOSIS — F4312 Post-traumatic stress disorder, chronic: Secondary | ICD-10-CM | POA: Diagnosis not present

## 2023-12-27 DIAGNOSIS — F331 Major depressive disorder, recurrent, moderate: Secondary | ICD-10-CM | POA: Diagnosis not present

## 2024-01-08 DIAGNOSIS — F410 Panic disorder [episodic paroxysmal anxiety] without agoraphobia: Secondary | ICD-10-CM | POA: Diagnosis not present

## 2024-01-08 DIAGNOSIS — F4312 Post-traumatic stress disorder, chronic: Secondary | ICD-10-CM | POA: Diagnosis not present

## 2024-01-08 DIAGNOSIS — F5105 Insomnia due to other mental disorder: Secondary | ICD-10-CM | POA: Diagnosis not present

## 2024-01-08 DIAGNOSIS — F331 Major depressive disorder, recurrent, moderate: Secondary | ICD-10-CM | POA: Diagnosis not present

## 2024-01-10 DIAGNOSIS — F4312 Post-traumatic stress disorder, chronic: Secondary | ICD-10-CM | POA: Diagnosis not present

## 2024-01-10 DIAGNOSIS — F331 Major depressive disorder, recurrent, moderate: Secondary | ICD-10-CM | POA: Diagnosis not present

## 2024-01-10 DIAGNOSIS — F410 Panic disorder [episodic paroxysmal anxiety] without agoraphobia: Secondary | ICD-10-CM | POA: Diagnosis not present

## 2024-01-15 DIAGNOSIS — F331 Major depressive disorder, recurrent, moderate: Secondary | ICD-10-CM | POA: Diagnosis not present

## 2024-01-15 DIAGNOSIS — F4312 Post-traumatic stress disorder, chronic: Secondary | ICD-10-CM | POA: Diagnosis not present

## 2024-01-15 DIAGNOSIS — F410 Panic disorder [episodic paroxysmal anxiety] without agoraphobia: Secondary | ICD-10-CM | POA: Diagnosis not present

## 2024-01-28 DIAGNOSIS — F331 Major depressive disorder, recurrent, moderate: Secondary | ICD-10-CM | POA: Diagnosis not present

## 2024-01-28 DIAGNOSIS — F4312 Post-traumatic stress disorder, chronic: Secondary | ICD-10-CM | POA: Diagnosis not present

## 2024-01-28 DIAGNOSIS — F410 Panic disorder [episodic paroxysmal anxiety] without agoraphobia: Secondary | ICD-10-CM | POA: Diagnosis not present

## 2024-02-11 DIAGNOSIS — F331 Major depressive disorder, recurrent, moderate: Secondary | ICD-10-CM | POA: Diagnosis not present

## 2024-02-11 DIAGNOSIS — F410 Panic disorder [episodic paroxysmal anxiety] without agoraphobia: Secondary | ICD-10-CM | POA: Diagnosis not present

## 2024-02-11 DIAGNOSIS — F4312 Post-traumatic stress disorder, chronic: Secondary | ICD-10-CM | POA: Diagnosis not present

## 2024-02-27 DIAGNOSIS — F4312 Post-traumatic stress disorder, chronic: Secondary | ICD-10-CM | POA: Diagnosis not present

## 2024-02-27 DIAGNOSIS — F410 Panic disorder [episodic paroxysmal anxiety] without agoraphobia: Secondary | ICD-10-CM | POA: Diagnosis not present

## 2024-02-27 DIAGNOSIS — F331 Major depressive disorder, recurrent, moderate: Secondary | ICD-10-CM | POA: Diagnosis not present

## 2024-03-06 DIAGNOSIS — I10 Essential (primary) hypertension: Secondary | ICD-10-CM | POA: Diagnosis not present

## 2024-03-06 DIAGNOSIS — E785 Hyperlipidemia, unspecified: Secondary | ICD-10-CM | POA: Diagnosis not present

## 2024-03-10 DIAGNOSIS — F410 Panic disorder [episodic paroxysmal anxiety] without agoraphobia: Secondary | ICD-10-CM | POA: Diagnosis not present

## 2024-03-10 DIAGNOSIS — F5105 Insomnia due to other mental disorder: Secondary | ICD-10-CM | POA: Diagnosis not present

## 2024-03-10 DIAGNOSIS — F4312 Post-traumatic stress disorder, chronic: Secondary | ICD-10-CM | POA: Diagnosis not present

## 2024-03-10 DIAGNOSIS — F331 Major depressive disorder, recurrent, moderate: Secondary | ICD-10-CM | POA: Diagnosis not present

## 2024-03-18 DIAGNOSIS — F410 Panic disorder [episodic paroxysmal anxiety] without agoraphobia: Secondary | ICD-10-CM | POA: Diagnosis not present

## 2024-03-18 DIAGNOSIS — F331 Major depressive disorder, recurrent, moderate: Secondary | ICD-10-CM | POA: Diagnosis not present

## 2024-03-18 DIAGNOSIS — F4312 Post-traumatic stress disorder, chronic: Secondary | ICD-10-CM | POA: Diagnosis not present

## 2024-04-04 DIAGNOSIS — F5102 Adjustment insomnia: Secondary | ICD-10-CM | POA: Diagnosis not present

## 2024-04-04 DIAGNOSIS — F4323 Adjustment disorder with mixed anxiety and depressed mood: Secondary | ICD-10-CM | POA: Diagnosis not present

## 2024-04-08 DIAGNOSIS — F331 Major depressive disorder, recurrent, moderate: Secondary | ICD-10-CM | POA: Diagnosis not present

## 2024-04-08 DIAGNOSIS — F410 Panic disorder [episodic paroxysmal anxiety] without agoraphobia: Secondary | ICD-10-CM | POA: Diagnosis not present

## 2024-04-08 DIAGNOSIS — F4312 Post-traumatic stress disorder, chronic: Secondary | ICD-10-CM | POA: Diagnosis not present

## 2024-04-22 DIAGNOSIS — F331 Major depressive disorder, recurrent, moderate: Secondary | ICD-10-CM | POA: Diagnosis not present

## 2024-04-22 DIAGNOSIS — F4312 Post-traumatic stress disorder, chronic: Secondary | ICD-10-CM | POA: Diagnosis not present

## 2024-04-22 DIAGNOSIS — F410 Panic disorder [episodic paroxysmal anxiety] without agoraphobia: Secondary | ICD-10-CM | POA: Diagnosis not present

## 2024-05-13 DIAGNOSIS — F4312 Post-traumatic stress disorder, chronic: Secondary | ICD-10-CM | POA: Diagnosis not present

## 2024-05-13 DIAGNOSIS — F410 Panic disorder [episodic paroxysmal anxiety] without agoraphobia: Secondary | ICD-10-CM | POA: Diagnosis not present

## 2024-05-13 DIAGNOSIS — F331 Major depressive disorder, recurrent, moderate: Secondary | ICD-10-CM | POA: Diagnosis not present

## 2024-05-28 DIAGNOSIS — F331 Major depressive disorder, recurrent, moderate: Secondary | ICD-10-CM | POA: Diagnosis not present

## 2024-05-28 DIAGNOSIS — F410 Panic disorder [episodic paroxysmal anxiety] without agoraphobia: Secondary | ICD-10-CM | POA: Diagnosis not present

## 2024-05-28 DIAGNOSIS — F4312 Post-traumatic stress disorder, chronic: Secondary | ICD-10-CM | POA: Diagnosis not present

## 2024-05-30 DIAGNOSIS — F4323 Adjustment disorder with mixed anxiety and depressed mood: Secondary | ICD-10-CM | POA: Diagnosis not present

## 2024-05-30 DIAGNOSIS — F5102 Adjustment insomnia: Secondary | ICD-10-CM | POA: Diagnosis not present
